# Patient Record
Sex: Female | Born: 1980 | Race: Black or African American | Hispanic: No | Marital: Single | Smoking: Former smoker
Health system: Southern US, Community
[De-identification: ages and names within clinical notes are randomized; demographics above are authoritative.]

## PROBLEM LIST (undated history)

## (undated) DIAGNOSIS — D573 Sickle-cell trait: Secondary | ICD-10-CM

## (undated) DIAGNOSIS — O1490 Unspecified pre-eclampsia, unspecified trimester: Secondary | ICD-10-CM

## (undated) DIAGNOSIS — H547 Unspecified visual loss: Secondary | ICD-10-CM

## (undated) HISTORY — DX: Unspecified pre-eclampsia, unspecified trimester: O14.90

## (undated) HISTORY — DX: Sickle-cell trait: D57.3

## (undated) HISTORY — DX: Unspecified visual loss: H54.7

## (undated) HISTORY — PX: WISDOM TOOTH EXTRACTION: SHX21

---

## 2006-05-29 ENCOUNTER — Emergency Department: Payer: Self-pay | Admitting: Emergency Medicine

## 2007-02-18 ENCOUNTER — Inpatient Hospital Stay: Payer: Self-pay | Admitting: Obstetrics and Gynecology

## 2008-04-04 ENCOUNTER — Emergency Department: Payer: Self-pay

## 2008-08-16 ENCOUNTER — Emergency Department: Payer: Self-pay | Admitting: Internal Medicine

## 2008-12-21 DIAGNOSIS — J45909 Unspecified asthma, uncomplicated: Secondary | ICD-10-CM | POA: Insufficient documentation

## 2011-04-20 ENCOUNTER — Inpatient Hospital Stay: Payer: Self-pay | Admitting: Obstetrics & Gynecology

## 2011-04-20 LAB — CBC WITH DIFFERENTIAL/PLATELET
Basophil #: 0 10*3/uL (ref 0.0–0.1)
Basophil %: 0.3 %
Eosinophil #: 0.1 10*3/uL (ref 0.0–0.7)
HGB: 11.9 g/dL — ABNORMAL LOW (ref 12.0–16.0)
Lymphocyte %: 20.8 %
MCH: 30.2 pg (ref 26.0–34.0)
MCHC: 33.8 g/dL (ref 32.0–36.0)
MCV: 89 fL (ref 80–100)
Monocyte #: 0.9 10*3/uL — ABNORMAL HIGH (ref 0.0–0.7)
Neutrophil %: 70 %
Platelet: 165 10*3/uL (ref 150–440)
RBC: 3.94 10*6/uL (ref 3.80–5.20)
WBC: 10.6 10*3/uL (ref 3.6–11.0)

## 2011-04-21 LAB — HEMATOCRIT: HCT: 31.8 % — ABNORMAL LOW (ref 35.0–47.0)

## 2012-03-03 ENCOUNTER — Emergency Department: Payer: Self-pay | Admitting: Emergency Medicine

## 2012-06-28 ENCOUNTER — Emergency Department: Payer: Self-pay | Admitting: Unknown Physician Specialty

## 2014-06-30 NOTE — H&P (Signed)
L&D Evaluation:  History Expanded:   HPI 34 yo G3P2 at 2540 + weeks EGA w estimated date of confinement 04/17/11 w Ctxs since 0530 but intensifying since 1700.  No vb or rom.  Good FM.   Prenatal Care at [facility] then [facility].  Plans Bilateral Tubal Ligation postpartum.  GBS +.    Gravida 3    Term 2    Group B Strep Results (Result >5wks must be treated as unknown) positive    Presents with contractions    Patient's Medical History No Chronic Illness    Patient's Surgical History none    Medications Pre Natal Vitamins    Allergies PCN    Social History none    Family History Non-Contributory   ROS:   ROS All systems were reviewed.  HEENT, CNS, GI, GU, Respiratory, CV, Renal and Musculoskeletal systems were found to be normal.   Exam:   Vital Signs stable    General no apparent distress    Mental Status clear    Chest clear    Heart normal sinus rhythm    Abdomen gravid, tender with contractions    Estimated Fetal Weight Average for gestational age    Back no CVAT    Edema no edema    Pelvic no external lesions, 7/80/0, BBOW    Mebranes Intact    FHT normal rate with no decels, 120 bpm rate. Possible decel when first put on monitor but 30 min of monitoring since until now shows no decels even w strong contractions    Ucx regular    Skin dry   Impression:   Impression active labor   Plan:   Plan monitor contractions and for cervical change, antibiotics for GBBS prophylaxis    Comments Wants epidural but understands there may not be enough time   Electronic Signatures: Letitia LibraHarris, Shyna Duignan Paul (MD)  (Signed [phone]-Feb-13 19:12)  Authored: L&D Evaluation   Last Updated: 28-Feb-13 19:12 by Letitia LibraHarris, Mounir Skipper Paul (MD)

## 2017-01-18 LAB — HM PAP SMEAR: HM Pap smear: NEGATIVE

## 2017-09-01 ENCOUNTER — Emergency Department
Admission: EM | Admit: 2017-09-01 | Discharge: 2017-09-01 | Disposition: A | Discharge status: Home / Self Care | Payer: Self-pay | Attending: Emergency Medicine | Admitting: Emergency Medicine

## 2017-09-01 ENCOUNTER — Encounter: Payer: Self-pay | Admitting: Emergency Medicine

## 2017-09-01 ENCOUNTER — Other Ambulatory Visit: Payer: Self-pay

## 2017-09-01 DIAGNOSIS — H10023 Other mucopurulent conjunctivitis, bilateral: Secondary | ICD-10-CM | POA: Insufficient documentation

## 2017-09-01 MED ORDER — OLOPATADINE HCL 0.2 % OP SOLN: 1.0000 [drp] | Freq: Every day | OPHTHALMIC | 0 refills | Status: DC

## 2017-09-01 MED ORDER — GENTAMICIN SULFATE 0.3 % OP SOLN: 1.0000 [drp] | OPHTHALMIC | 0 refills | Status: DC

## 2017-09-01 NOTE — ED Triage Notes (Signed)
Bilateral eye irritation and drainage.

## 2017-09-01 NOTE — ED Notes (Signed)
See triage note  Presents with pain and irritation to both eyes  States sx's started on left eye first  Both eyes were matted shut this am

## 2017-09-01 NOTE — ED Provider Notes (Signed)
[facility] Emergency Department Provider Note   ____________________________________________   First MD Initiated Contact with Patient 09/01/17 1228     (approximate)  I have reviewed the triage vital signs and the nursing notes.   HISTORY  Chief Complaint Eye Problem    HPI Tara Salas is a 37 y.o. female patient complaining of bilateral eye irritation, drainage, and matted eyelids.  Patient complains started with the left eye is migrated to the left eye.  Patient denies vision disturbance.  Patient rates her pain as a 5/10.  Patient described the pain is "itchy".  Patient do not wear contact lenses.  No palliative measure for complaint.  Patient is photophobic.   History reviewed. No pertinent past medical history.  There are no active problems to display for this patient.   History reviewed. No pertinent surgical history.  Prior to Admission medications   Medication Sig Start Date End Date Taking? Authorizing Provider  gentamicin (GARAMYCIN) 0.3 % ophthalmic solution Place 1 drop into both eyes every 4 (four) hours. 09/01/17   Joni ReiningSmith, Ronald K, PA-C  Olopatadine HCl 0.2 % SOLN Apply 1 drop to eye daily. 09/01/17   Joni ReiningSmith, Ronald K, PA-C    Allergies Penicillins  No family history on file.  Social History Social History   Tobacco Use  . Smoking status: Not on file  Substance Use Topics  . Alcohol use: Not on file  . Drug use: Not on file    Review of Systems Constitutional: No fever/chills Eyes: No visual changes.  Photophobic.  Matted eyelids.  Bilateral redness. ENT: No sore throat. Cardiovascular: Denies chest pain. Respiratory: Denies shortness of breath. Gastrointestinal: No abdominal pain.  No nausea, no vomiting.  No diarrhea.  No constipation. Genitourinary: Negative for dysuria. Musculoskeletal: Negative for back pain. Skin: Negative for rash. Neurological: Negative for headaches, focal weakness or  numbness.   ____________________________________________   PHYSICAL EXAM:  VITAL SIGNS: ED Triage Vitals  Enc Vitals Group     BP 09/01/17 1154 139/88     Pulse Rate 09/01/17 1154 68     Resp 09/01/17 1154 18     Temp 09/01/17 1154 99.3 F (37.4 C)     Temp Source 09/01/17 1154 Oral     SpO2 09/01/17 1154 100 %     Weight 09/01/17 1156 143 lb (64.9 kg)     Height 09/01/17 1156 5\' 4"  (1.626 m)     Head Circumference --      Peak Flow --      Pain Score 09/01/17 1155 5     Pain Loc --      Pain Edu? --      Excl. in GC? --    Constitutional: Alert and oriented. Well appearing and in no acute distress. Eyes: Conjunctivae are erythematous.  Photophobic.  PERRL. EOMI. Head: Atraumatic.  Dry secretions bilateral eyelids. Nose: No congestion/rhinnorhea. Cardiovascular: Normal rate, regular rhythm. Grossly normal heart sounds.  Good peripheral circulation. Respiratory: Normal respiratory effort.  No retractions. Lungs CTAB. Musculoskeletal: No lower extremity tenderness nor edema.  No joint effusions. Neurologic:  Normal speech and language. No gross focal neurologic deficits are appreciated. No gait instability. Skin:  Skin is warm, dry and intact. No rash noted. Psychiatric: Mood and affect are normal. Speech and behavior are normal.  ____________________________________________   LABS (all labs ordered are listed, but only abnormal results are displayed)  Labs Reviewed - No data to display ____________________________________________  EKG   ____________________________________________  RADIOLOGY  ED MD interpretation:    Official radiology report(s): No results found.  ____________________________________________   PROCEDURES  Procedure(s) performed: None  Procedures  Critical Care performed: No  ____________________________________________   INITIAL IMPRESSION / ASSESSMENT AND PLAN / ED COURSE  As part of my medical decision making, I reviewed the  following data within the electronic MEDICAL RECORD NUMBER    Patient presents with bilateral conjunctivitis patient.  Patient given discharge care instruction advised use eyedrops as directed.  Patient [facility].      ____________________________________________   FINAL CLINICAL IMPRESSION(S) / ED DIAGNOSES  Final diagnoses:  Other mucopurulent conjunctivitis of both eyes     ED Discharge Orders        Ordered    gentamicin (GARAMYCIN) 0.3 % ophthalmic solution  Every 4 hours     09/01/17 1239    Olopatadine HCl 0.2 % SOLN  Daily     09/01/17 1239       Note:  This document was prepared using Dragon voice recognition software and may include unintentional dictation errors.    Joni Reining, PA-C 09/01/17 1244    Jeanmarie Plant, MD 09/02/17 1140

## 2017-12-10 ENCOUNTER — Emergency Department
Admission: EM | Admit: 2017-12-10 | Discharge: 2017-12-10 | Disposition: A | Discharge status: Home / Self Care | Payer: Medicaid Other | Attending: Emergency Medicine | Admitting: Emergency Medicine

## 2017-12-10 ENCOUNTER — Encounter: Payer: Self-pay | Admitting: Emergency Medicine

## 2017-12-10 DIAGNOSIS — H10021 Other mucopurulent conjunctivitis, right eye: Secondary | ICD-10-CM | POA: Insufficient documentation

## 2017-12-10 DIAGNOSIS — H109 Unspecified conjunctivitis: Secondary | ICD-10-CM

## 2017-12-10 MED ORDER — TOBRAMYCIN 0.3 % OP SOLN: 2.0000 [drp] | OPHTHALMIC | 0 refills | Status: DC

## 2017-12-10 NOTE — Discharge Instructions (Signed)
Follow-up with your primary care provider if any continued problems.  Follow-up with [facility] if any severe worsening of your eye problem.  Begin using eyedrops every 4 hours while awake.  Make sure to wash your hands immediately after putting the eyedrops. If you begin having symptoms in your left eye also begin using the eyedrops to the left eye as well.

## 2017-12-10 NOTE — ED Provider Notes (Signed)
[facility] Emergency Department Provider Note   ____________________________________________   First MD Initiated Contact with Patient 12/10/17 1352     (approximate)  I have reviewed the triage vital signs and the nursing notes.   HISTORY  Chief Complaint Eye Drainage and Conjunctivitis   HPI Tara Salas is a 37 y.o. female presents to the ED with complaint of right eye drainage and redness for the last 2 days.  She states that she has 3 sons at home who recently treated for pinkeye.  She states that she thought that she was being extremely cautious with washing her hands after applying the eyedrops.  She denies any visual changes.  She used some over-the-counter eyedrops yesterday without any relief.  History reviewed. No pertinent past medical history.  There are no active problems to display for this patient.   History reviewed. No pertinent surgical history.  Prior to Admission medications   Medication Sig Start Date End Date Taking? Authorizing Provider  tobramycin (TOBREX) 0.3 % ophthalmic solution Place 2 drops into the right eye every 4 (four) hours. While awake 12/10/17   Tommi Rumps, PA-C    Allergies Penicillins  No family history on file.  Social History Social History   Tobacco Use  . Smoking status: Not on file  Substance Use Topics  . Alcohol use: Not on file  . Drug use: Not on file    Review of Systems Constitutional: No fever/chills Eyes: No visual changes.  Positive right eye drainage. ENT: Negative for rhinorrhea. Cardiovascular: Denies chest pain. Respiratory: Denies shortness of breath. Skin: Negative for rash. Neurological: Negative for headaches ___________________________________________   PHYSICAL EXAM:  VITAL SIGNS: ED Triage Vitals  Enc Vitals Group     BP 12/10/17 1306 (!) 148/78     Pulse Rate 12/10/17 1306 78     Resp 12/10/17 1306 20     Temp 12/10/17 1306 98.6 F (37 C)     Temp  Source 12/10/17 1306 Oral     SpO2 12/10/17 1306 99 %     Weight 12/10/17 1302 139 lb (63 kg)     Height 12/10/17 1302 5\' 4"  (1.626 m)     Head Circumference --      Peak Flow --      Pain Score 12/10/17 1302 4     Pain Loc --      Pain Edu? --      Excl. in GC? --    Constitutional: Alert and oriented. Well appearing and in no acute distress. Eyes: Conjunctivae on the right is injected with exudate present.  Exudate is also in the lashes.  Left conjunctivo-at this time is clear.  PERRL. EOMI. Head: Atraumatic. Nose: No congestion/rhinnorhea. Mouth/Throat: Mucous membranes are moist.  Oropharynx non-erythematous. Neck: No stridor.   Cardiovascular: Normal rate, regular rhythm. Grossly normal heart sounds.  Good peripheral circulation. Respiratory: Normal respiratory effort.  No retractions. Lungs CTAB. Musculoskeletal: Moves upper and lower extremities with any difficulty. Neurologic:  Normal speech and language. No gross focal neurologic deficits are appreciated. No gait instability. Skin:  Skin is warm, dry and intact. No rash noted. Psychiatric: Mood and affect are normal. Speech and behavior are normal.  ____________________________________________   LABS (all labs ordered are listed, but only abnormal results are displayed)  Labs Reviewed - No data to display  PROCEDURES  Procedure(s) performed: None  Procedures  Critical Care performed: No  ____________________________________________   INITIAL IMPRESSION / ASSESSMENT AND PLAN /  ED COURSE  As part of my medical decision making, I reviewed the following data within the electronic MEDICAL RECORD NUMBER Notes from prior ED visits and [place] Controlled Substance Database  Patient presents to the ED with complaint of right eye drainage and redness for 2 days.  She has 3 sons at home that was recently treated for conjunctivitis and she has been using eyedrops for them.  She thought that she was being extremely careful with  washing her hands but noticed redness beginning 2 days ago which is continued to get worse along with the drainage.  Exam is consistent with bacterial conjunctivitis.  Patient was given a prescription for tobramycin ophthalmic solution.  She is encouraged to follow-up with her PCP if any continued problems and [facility] if any worsening or not improving..  ____________________________________________   FINAL CLINICAL IMPRESSION(S) / ED DIAGNOSES  Final diagnoses:  Bacterial conjunctivitis of right eye     ED Discharge Orders         Ordered    tobramycin (TOBREX) 0.3 % ophthalmic solution  Every 4 hours     12/10/17 1433           Note:  This document was prepared using Dragon voice recognition software and may include unintentional dictation errors.    Tommi Rumps, PA-C 12/10/17 1441    Minna Antis, MD 12/10/17 1534

## 2017-12-10 NOTE — ED Triage Notes (Signed)
Pt reports itching and draining to right eye for the past 2 days.

## 2018-06-14 ENCOUNTER — Emergency Department
Admission: EM | Admit: 2018-06-14 | Discharge: 2018-06-14 | Disposition: A | Discharge status: Home / Self Care | Payer: Medicaid Other | Attending: Emergency Medicine | Admitting: Emergency Medicine

## 2018-06-14 ENCOUNTER — Other Ambulatory Visit: Payer: Self-pay

## 2018-06-14 DIAGNOSIS — T7421XA Adult sexual abuse, confirmed, initial encounter: Secondary | ICD-10-CM | POA: Insufficient documentation

## 2018-06-14 LAB — WET PREP, GENITAL
Clue Cells Wet Prep HPF POC: NONE SEEN
Sperm: NONE SEEN
Trich, Wet Prep: NONE SEEN
Yeast Wet Prep HPF POC: NONE SEEN

## 2018-06-14 LAB — RAPID HIV SCREEN (HIV 1/2 AB+AG)
HIV 1/2 Antibodies: NONREACTIVE
HIV-1 P24 Antigen - HIV24: NONREACTIVE

## 2018-06-14 LAB — CHLAMYDIA/NGC RT PCR (ARMC ONLY)
Chlamydia Tr: NOT DETECTED
N gonorrhoeae: NOT DETECTED

## 2018-06-14 MED ORDER — AZITHROMYCIN 500 MG PO TABS
1000.0000 mg | ORAL_TABLET | Freq: Once | ORAL | Status: AC
  Administered 2018-06-14: 1000 mg via ORAL
  Filled 2018-06-14: qty 2

## 2018-06-14 MED ORDER — HEPATITIS B VAC RECOMBINANT 10 MCG/0.5ML IJ SUSP
0.5000 mL | Freq: Once | INTRAMUSCULAR | Status: AC
  Administered 2018-06-14: 13:00:00 0.5 mL via INTRAMUSCULAR
  Filled 2018-06-14: qty 0.5

## 2018-06-14 MED ORDER — HEPATITIS B VAC RECOMBINANT 10 MCG/0.5ML IJ SUSP
1.0000 mL | Freq: Once | INTRAMUSCULAR | Status: DC
  Filled 2018-06-14: qty 1

## 2018-06-14 MED ORDER — METRONIDAZOLE 500 MG PO TABS
2000.0000 mg | ORAL_TABLET | Freq: Once | ORAL | Status: AC
  Administered 2018-06-14: 2000 mg via ORAL
  Filled 2018-06-14: qty 4

## 2018-06-14 MED ORDER — DOXYCYCLINE HYCLATE 100 MG PO CAPS: 100.0000 mg | ORAL_CAPSULE | Freq: Two times a day (BID) | ORAL | 0 refills | Status: AC
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
## 2018-06-14 NOTE — ED Notes (Signed)
SANE  NURSE  CALLED PER  DR  GOODMAN  MD 

## 2018-06-14 NOTE — ED Notes (Signed)
Pt on phone with sane nurse at this time

## 2018-06-14 NOTE — ED Notes (Signed)
This RN spoke with the sane RN who would like to speak with the pt, sane nurse to call back to get additional info

## 2018-06-14 NOTE — ED Notes (Signed)
[place] [employer] officer at front desk notified, and will contact the correct office to come speak to the patient and take report.
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
## 2018-06-15 LAB — RPR: RPR Ser Ql: NONREACTIVE
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
## 2018-07-30 LAB — HM HIV SCREENING LAB: HM HIV Screening: NEGATIVE

## 2018-09-06 ENCOUNTER — Other Ambulatory Visit: Payer: Self-pay

## 2018-09-06 ENCOUNTER — Ambulatory Visit: Payer: Self-pay | Admitting: Physician Assistant

## 2018-09-06 ENCOUNTER — Encounter: Payer: Self-pay | Admitting: Physician Assistant

## 2018-09-06 DIAGNOSIS — Z113 Encounter for screening for infections with a predominantly sexual mode of transmission: Secondary | ICD-10-CM

## 2018-09-06 DIAGNOSIS — B9689 Other specified bacterial agents as the cause of diseases classified elsewhere: Secondary | ICD-10-CM

## 2018-09-06 DIAGNOSIS — N760 Acute vaginitis: Secondary | ICD-10-CM

## 2018-09-06 LAB — WET PREP FOR TRICH, YEAST, CLUE
Trichomonas Exam: NEGATIVE
Yeast Exam: NEGATIVE

## 2018-09-06 MED ORDER — METRONIDAZOLE 500 MG PO TABS: 500.0000 mg | ORAL_TABLET | Freq: Two times a day (BID) | ORAL | 0 refills | Status: DC

## 2018-09-06 NOTE — Progress Notes (Signed)
STI clinic/screening visit  Subjective:  Tara Salas is a 38 y.o. female being seen today for an STI screening visit. The patient reports they do have symptoms.  Patient has the following medical conditionshas Asthma on their problem list.  Chief Complaint  Patient presents with  . SEXUALLY TRANSMITTED DISEASE    Patient reports vaginal itching and odor for a few days.  Denies other symptoms.  Reports new partner and desires screening for this reason as well. HPI   See flowsheet for further details and programmatic requirements.    The following portions of the patient's history were reviewed and updated as appropriate: allergies, current medications, past family history, past medical history, past social history, past surgical history and problem list. Problem list updated.  Objective:  There were no vitals filed for this visit.  Physical Exam Constitutional:      General: She is not in acute distress.    Appearance: Normal appearance.  HENT:     Head: Normocephalic and atraumatic.     Mouth/Throat:     Mouth: Mucous membranes are moist.     Pharynx: Oropharynx is clear. No oropharyngeal exudate or posterior oropharyngeal erythema.  Neck:     Musculoskeletal: Neck supple.  Pulmonary:     Effort: Pulmonary effort is normal.  Abdominal:     Palpations: Abdomen is soft. There is no mass.     Tenderness: There is no abdominal tenderness. There is no guarding or rebound.  Genitourinary:    General: Normal vulva.     Rectum: Normal.     Comments: External genitalia/pubic area without nits, lice, edema,erythema, lesions or inguinal adenopathy. Vagina with normal mucosa, moderate amount of thick, white, adherent discharge, pH=> 4.5. Cervix without visible lesions. Uterus normal size, firm, mobile, nt, no CMT, no masses, no adnexal tenderness or fullness. Lymphadenopathy:     Cervical: No cervical adenopathy.  Skin:    General: Skin is warm and dry.     Findings: No  bruising, erythema, lesion or rash.  Neurological:     Mental Status: She is alert and oriented to person, place, and time.  Psychiatric:        Mood and Affect: Mood normal.        Behavior: Behavior normal.        Thought Content: Thought content normal.        Judgment: Judgment normal.       Assessment and Plan:  Tara Salas is a 38 y.o. female presenting to the [facility] Department for STI screening  1. Screening for STD (sexually transmitted disease) Patient with symptoms today. Rec condoms with all sex Await test results.  Counseled that RN will call if needs to RTC for further treatment. - WET PREP FOR TRICH, YEAST, CLUE - Gonococcus culture - Chlamydia/Gonorrhea [place] [facility] - HIV [place] [facility] - Syphilis Serology,  Lab - metroNIDAZOLE (FLAGYL) 500 MG tablet; Take 1 tablet (500 mg total) by mouth 2 (two) times daily.  Dispense: 14 tablet; Refill: 0  2. BV (bacterial vaginosis) Will treat with Metronidazole 500mg  #14 1 po BID for 7days with food, no EtOH for 24 hr before and until 72 hr after completing medicine. No sex for 7days RTC prn  - metroNIDAZOLE (FLAGYL) 500 MG tablet; Take 1 tablet (500 mg total) by mouth 2 (two) times daily.  Dispense: 14 tablet; Refill: 0     No follow-ups on file.  Future Appointments  Date Time Provider  Department Center  09/12/2018  8:20 AM [facility] NURSE [facility] None    Matt Holmesarla J Jsean Taussig, PA

## 2018-09-11 LAB — GONOCOCCUS CULTURE

## 2018-09-12 ENCOUNTER — Other Ambulatory Visit: Payer: Self-pay

## 2018-09-12 ENCOUNTER — Ambulatory Visit (LOCAL_COMMUNITY_HEALTH_CENTER): Payer: Medicaid Other

## 2018-09-12 VITALS — BP 136/87 | Ht 64.5 in | Wt 153.3 lb

## 2018-09-12 DIAGNOSIS — Z30013 Encounter for initial prescription of injectable contraceptive: Secondary | ICD-10-CM | POA: Diagnosis not present

## 2018-09-12 DIAGNOSIS — Z3042 Encounter for surveillance of injectable contraceptive: Secondary | ICD-10-CM

## 2018-09-12 DIAGNOSIS — Z3009 Encounter for other general counseling and advice on contraception: Secondary | ICD-10-CM | POA: Diagnosis not present

## 2018-09-12 MED ORDER — MEDROXYPROGESTERONE ACETATE 150 MG/ML IM SUSP
150.0000 mg | Freq: Once | INTRAMUSCULAR | Status: AC
  Administered 2018-09-12: 09:00:00 150 mg via INTRAMUSCULAR

## 2018-09-12 NOTE — Progress Notes (Signed)
Last physical with [facility] 01/22/2018. Last depo 06/27/2018 with [facility]; 11.0 weeks post depo. DMPA 150 mg IM today per Donnal Moat, CNM order dated 01/22/2018. Female condoms given. MVI declined.

## 2018-11-28 ENCOUNTER — Other Ambulatory Visit: Payer: Self-pay

## 2018-11-28 ENCOUNTER — Ambulatory Visit (LOCAL_COMMUNITY_HEALTH_CENTER): Payer: Medicaid Other

## 2018-11-28 VITALS — BP 140/85 | Ht 65.5 in | Wt 151.0 lb

## 2018-11-28 DIAGNOSIS — Z3042 Encounter for surveillance of injectable contraceptive: Secondary | ICD-10-CM | POA: Diagnosis not present

## 2018-11-28 DIAGNOSIS — Z30013 Encounter for initial prescription of injectable contraceptive: Secondary | ICD-10-CM | POA: Diagnosis not present

## 2018-11-28 DIAGNOSIS — Z3009 Encounter for other general counseling and advice on contraception: Secondary | ICD-10-CM | POA: Diagnosis not present

## 2018-11-28 MED ORDER — MEDROXYPROGESTERONE ACETATE 150 MG/ML IM SUSP
150.0000 mg | Freq: Once | INTRAMUSCULAR | Status: AC
  Administered 2018-11-28: 16:00:00 150 mg via INTRAMUSCULAR

## 2018-11-28 NOTE — Progress Notes (Signed)
Depo given per E. Sciora CNM 01/2018 order. Tolerated well Aileen Fass, RN

## 2019-02-12 ENCOUNTER — Other Ambulatory Visit: Payer: Self-pay

## 2019-02-12 ENCOUNTER — Ambulatory Visit (LOCAL_COMMUNITY_HEALTH_CENTER): Payer: Medicaid Other

## 2019-02-12 VITALS — BP 134/81 | Ht 65.5 in | Wt 154.0 lb

## 2019-02-12 DIAGNOSIS — Z3042 Encounter for surveillance of injectable contraceptive: Secondary | ICD-10-CM

## 2019-02-12 DIAGNOSIS — Z30013 Encounter for initial prescription of injectable contraceptive: Secondary | ICD-10-CM | POA: Diagnosis not present

## 2019-02-12 DIAGNOSIS — Z3009 Encounter for other general counseling and advice on contraception: Secondary | ICD-10-CM

## 2019-02-12 MED ORDER — MEDROXYPROGESTERONE ACETATE 150 MG/ML IM SUSP
150.0000 mg | Freq: Once | INTRAMUSCULAR | Status: AC
  Administered 2019-02-12: 150 mg via INTRAMUSCULAR

## 2019-02-12 NOTE — Progress Notes (Signed)
Depo given per Ernestina Patches S.O. (1 Depo after PE expired). Tolerated well. Aileen Fass, RN

## 2019-04-30 ENCOUNTER — Other Ambulatory Visit: Payer: Self-pay | Admitting: Physician Assistant

## 2019-04-30 DIAGNOSIS — Z3042 Encounter for surveillance of injectable contraceptive: Secondary | ICD-10-CM

## 2019-04-30 MED ORDER — MEDROXYPROGESTERONE ACETATE 150 MG/ML IM SUSP
150.0000 mg | INTRAMUSCULAR | Status: AC
  Administered 2019-05-01 – 2019-12-19 (×4): 150 mg via INTRAMUSCULAR

## 2019-04-30 NOTE — Progress Notes (Signed)
Per chart review, last RP 01/2018, CBE due 2020 and pap due 12/2019.  Provided that patient desires to continue with Depo and BP is normal. OK to continue with Depo.  If abnl and FP consents have not been signed, please have patient sign.

## 2019-05-01 ENCOUNTER — Ambulatory Visit (LOCAL_COMMUNITY_HEALTH_CENTER): Payer: Medicaid Other

## 2019-05-01 ENCOUNTER — Other Ambulatory Visit: Payer: Self-pay

## 2019-05-01 VITALS — BP 127/87 | Ht 65.5 in | Wt 149.5 lb

## 2019-05-01 DIAGNOSIS — Z30013 Encounter for initial prescription of injectable contraceptive: Secondary | ICD-10-CM | POA: Diagnosis not present

## 2019-05-01 DIAGNOSIS — Z3009 Encounter for other general counseling and advice on contraception: Secondary | ICD-10-CM

## 2019-05-01 NOTE — Progress Notes (Signed)
nitial BP on entrance to exam room 140/90. Repeat BP 10 minutes after sitting was 127/87. Depo administered without difficulty per 04/30/19 written order of Sadie Haber PA and client tolerated without complaint. Client purchases MVI and takes ~ 2 - 3 times weekly. Folic acid counseling completed and encouraged to take QD. Jossie Ng, RN Family Planning and Abnormal Findings consents signed and sent for scanning. Jossie Ng, RN

## 2019-06-25 ENCOUNTER — Encounter: Payer: Self-pay | Admitting: Advanced Practice Midwife

## 2019-06-25 ENCOUNTER — Other Ambulatory Visit: Payer: Self-pay

## 2019-06-25 ENCOUNTER — Ambulatory Visit (LOCAL_COMMUNITY_HEALTH_CENTER): Payer: Medicaid Other | Admitting: Advanced Practice Midwife

## 2019-06-25 VITALS — BP 130/88 | Ht 65.5 in | Wt 153.6 lb

## 2019-06-25 DIAGNOSIS — E663 Overweight: Secondary | ICD-10-CM

## 2019-06-25 DIAGNOSIS — F1290 Cannabis use, unspecified, uncomplicated: Secondary | ICD-10-CM

## 2019-06-25 DIAGNOSIS — F17200 Nicotine dependence, unspecified, uncomplicated: Secondary | ICD-10-CM | POA: Insufficient documentation

## 2019-06-25 DIAGNOSIS — Z3009 Encounter for other general counseling and advice on contraception: Secondary | ICD-10-CM | POA: Diagnosis not present

## 2019-06-25 DIAGNOSIS — Z3042 Encounter for surveillance of injectable contraceptive: Secondary | ICD-10-CM

## 2019-06-25 LAB — WET PREP FOR TRICH, YEAST, CLUE
Trichomonas Exam: NEGATIVE
Yeast Exam: NEGATIVE

## 2019-06-25 MED ORDER — MULTI-VITAMIN/MINERALS PO TABS: 1.0000 | ORAL_TABLET | Freq: Every day | ORAL | 0 refills | Status: DC

## 2019-06-25 NOTE — Progress Notes (Signed)
Wet mount reviewed by Hazle Coca CNM and no treatment needed per provider / standing order. Jossie Ng, RN

## 2019-06-25 NOTE — Progress Notes (Signed)
Family Planning Visit- Initial Visit  Subjective:  Tara Salas is a 39 y.o. SBF smoker G6P3003   being seen today for an initial well woman visit and to discuss family planning options.  She is currently using DMPA for pregnancy prevention. Patient reports she does not want a pregnancy in the next year.  Patient has the following medical conditions has Asthma; Smoker 1 ppd; and Marijuana use on their problem list.  Chief Complaint  Patient presents with  . Annual Exam    (Last Depo 05/01/2019)    Patient reports happy with DMPA and no menses since began.  Last PE 01/2018.  CBE due 2020.  Last pap 01/18/17 neg HPV neg.  Last sex 03/2019 without condom.  No sexual partner currently.  Last ETOH 06/21/19.  Last MJ 05/24/19.  Working 40 hrs/wk.  Smoker 1/2-1 ppd  Patient denies any problems   Body mass index is 25.17 kg/m. - Patient is eligible for diabetes screening based on BMI and age >53?  not applicable IR4E ordered? not applicable  Patient reports 1 of partners in last year. Desires STI screening?  Yes  Has patient been screened once for HCV in the past?  No  No results found for: HCVAB  Does the patient have current of drug use, have a partner with drug use, and/or has been incarcerated since last result? Yes  If yes-- Screen for HCV through [facility]   Does the patient meet criteria for HBV testing? Yes  Criteria:  -Household, sexual or needle sharing contact with HBV -History of drug use -HIV positive -Those with known Hep C   Health Maintenance Due  Topic Date Due  . COVID-19 Vaccine (1) Never done  . TETANUS/TDAP  Never done    Review of Systems  Constitutional: Negative.   HENT: Negative.   Respiratory: Negative.   Cardiovascular: Negative.   Gastrointestinal: Negative.   Genitourinary: Negative.   Musculoskeletal: Negative.   Skin: Negative.   Neurological: Negative.   Endo/Heme/Allergies: Negative.   Psychiatric/Behavioral: Negative.   All other  systems reviewed and are negative.   The following portions of the patient's history were reviewed and updated as appropriate: allergies, current medications, past family history, past medical history, past social history, past surgical history and problem list. Problem list updated.   See flowsheet for other program required questions.  Objective:   Vitals:   06/25/19 0835  BP: 130/88  Weight: 153 lb 9.6 oz (69.7 kg)  Height: 5' 5.5" (1.664 m)    Physical Exam Constitutional:      Appearance: Normal appearance. She is normal weight.  HENT:     Head: Normocephalic and atraumatic.     Mouth/Throat:     Mouth: Mucous membranes are moist.  Eyes:     Conjunctiva/sclera: Conjunctivae normal.  Cardiovascular:     Rate and Rhythm: Normal rate and regular rhythm.  Pulmonary:     Effort: Pulmonary effort is normal.     Breath sounds: Normal breath sounds.  Chest:     Breasts:        Right: Normal.        Left: Normal.  Abdominal:     Palpations: Abdomen is soft.  Genitourinary:    General: Normal vulva.     Exam position: Lithotomy position.     Rectum: Normal.  Musculoskeletal:        General: Normal range of motion.     Cervical back: Normal range of motion and neck  supple.  Skin:    General: Skin is warm and dry.  Neurological:     Mental Status: She is alert.  Psychiatric:        Mood and Affect: Mood normal.       Assessment and Plan:  Tara Salas is a 39 y.o. female presenting to the [facility] Department for an initial well woman exam/family planning visit  Contraception counseling: Reviewed all forms of birth control options in the tiered based approach. available including abstinence; over the counter/barrier methods; hormonal contraceptive medication including pill, patch, ring, injection,contraceptive implant, ECP; hormonal and nonhormonal IUDs; permanent sterilization options including vasectomy and the various tubal sterilization  modalities. Risks, benefits, and typical effectiveness rates were reviewed.  Questions were answered.  Written information was also given to the patient to review.  Patient desires DMPA, this was prescribed for patient. She will follow up on 07/17/19 for surveillance.  She was told to call with any further questions, or with any concerns about this method of contraception.  Emphasized use of condoms 100% of the time for STI prevention.  Patient was offered ECP. ECP was not accepted by the patient. ECP counseling was not given - see RN documentation  1. Family planning Normal yearly exam - Multiple Vitamins-Minerals (MULTIVITAMIN WITH MINERALS) tablet; Take 1 tablet by mouth daily.  Dispense: 100 tablet; Refill: 0 - WET PREP FOR TRICH, YEAST, CLUE - Syphilis Serology, [place] [facility] - HIV/HCV [place] [facility] - HBV Antigen/[facility] [place] [facility] - Chlamydia/Gonorrhea [place] [facility] - IGP, Aptima HPV  2. Encounter for surveillance of injectable contraceptive May have DMPA 150 mg IM q 11-13 wks x 1 year (not today) Please schedule DMPA 07/17/19  3. Smoker 1 ppd Counseled via 5 A's to stop smoking  4. Marijuana use Counseled not to use     No follow-ups on file.  No future appointments.  Alberteen Spindle, CNM

## 2019-06-30 LAB — IGP, APTIMA HPV
HPV Aptima: NEGATIVE
PAP Smear Comment: 0

## 2019-07-01 ENCOUNTER — Encounter: Payer: Self-pay | Admitting: Advanced Practice Midwife

## 2019-07-01 DIAGNOSIS — R87619 Unspecified abnormal cytological findings in specimens from cervix uteri: Secondary | ICD-10-CM | POA: Insufficient documentation

## 2019-07-01 LAB — HM HIV SCREENING LAB: HM HIV Screening: NEGATIVE

## 2019-07-01 LAB — HM HEPATITIS C SCREENING LAB: HM Hepatitis Screen: NEGATIVE

## 2019-07-02 LAB — HEPATITIS B SURFACE ANTIGEN

## 2019-07-18 ENCOUNTER — Other Ambulatory Visit: Payer: Self-pay

## 2019-07-18 ENCOUNTER — Ambulatory Visit (LOCAL_COMMUNITY_HEALTH_CENTER): Payer: Medicaid Other

## 2019-07-18 VITALS — BP 136/95 | Ht 65.5 in | Wt 153.0 lb

## 2019-07-18 DIAGNOSIS — Z3009 Encounter for other general counseling and advice on contraception: Secondary | ICD-10-CM

## 2019-07-18 DIAGNOSIS — Z3042 Encounter for surveillance of injectable contraceptive: Secondary | ICD-10-CM

## 2019-07-18 DIAGNOSIS — Z30013 Encounter for initial prescription of injectable contraceptive: Secondary | ICD-10-CM

## 2019-07-18 NOTE — Progress Notes (Addendum)
Here for depo. Consult with C.Hampton, PA d/t elevated BP of 136/95. BP retaken with reading of 130/90. Pt. Denies any previous BP issues and admits to smoking directly before appt.Also reports anxiety during  Dr office appts.  Pt. Counseled re: smoking risks. Provider advises to continue with depo from order 06/25/19 by Hazle Coca, CNM. Depo 150mg  IM given today with problem. , RN Note: Depo given today without problem. Pt. Tolerated well. Chart routed to C. Upton, STAVANGER. [place], RN

## 2019-07-18 NOTE — Progress Notes (Signed)
Consulted by RN re:  BP elevation and patient here for Depo.  OK given for Depo today.  Rec that patient not smoke for 1-2 hr prior to appointments and follow up with PCP.  Reviewed RN note and agree with note as documented.

## 2019-08-26 LAB — HEPATITIS B SURFACE ANTIGEN

## 2019-08-28 ENCOUNTER — Encounter: Payer: Self-pay | Admitting: Family Medicine

## 2019-10-03 ENCOUNTER — Other Ambulatory Visit: Payer: Self-pay

## 2019-10-03 ENCOUNTER — Ambulatory Visit (LOCAL_COMMUNITY_HEALTH_CENTER): Payer: Medicaid Other

## 2019-10-03 VITALS — BP 150/102 | Ht 65.0 in | Wt 160.0 lb

## 2019-10-03 DIAGNOSIS — Z3009 Encounter for other general counseling and advice on contraception: Secondary | ICD-10-CM

## 2019-10-03 DIAGNOSIS — Z3042 Encounter for surveillance of injectable contraceptive: Secondary | ICD-10-CM

## 2019-10-03 NOTE — Progress Notes (Signed)
Pateint reports no signs or symptoms today related to elevated blood pressure, but lately her ankles and feet have been swelling and she has had vision spells with seeing dots.  She denies any headaches. Consult with Sadie Haber, PA regarding elevated blood pressure today 150/102 and 2nd blood pressure 141/100.   Per Albin Felling: Ok to give Depo today, but patient to see PCP ASAP for an evaluation. DMPA 150mg  given IM today per Verbal Order by , PA related to elevated BP and tolerated well.  Pateint given PCP resource sheet due to not currently having one. Sadie Haber, RN

## 2019-12-19 ENCOUNTER — Other Ambulatory Visit: Payer: Self-pay

## 2019-12-19 ENCOUNTER — Ambulatory Visit (LOCAL_COMMUNITY_HEALTH_CENTER): Payer: Medicaid Other

## 2019-12-19 VITALS — BP 138/82 | Ht 65.0 in | Wt 158.0 lb

## 2019-12-19 DIAGNOSIS — Z3009 Encounter for other general counseling and advice on contraception: Secondary | ICD-10-CM

## 2019-12-19 DIAGNOSIS — Z3042 Encounter for surveillance of injectable contraceptive: Secondary | ICD-10-CM

## 2019-12-19 DIAGNOSIS — Z30013 Encounter for initial prescription of injectable contraceptive: Secondary | ICD-10-CM

## 2019-12-19 NOTE — Progress Notes (Signed)
Pt is 11.0 weeks post depo today. DMPA 150 mg IM administered per Arnetha Courser, CNM order dated 06/25/19 (1 year depo order).

## 2020-03-09 ENCOUNTER — Other Ambulatory Visit: Payer: Self-pay

## 2020-03-09 ENCOUNTER — Ambulatory Visit (LOCAL_COMMUNITY_HEALTH_CENTER): Payer: Medicaid Other

## 2020-03-09 VITALS — BP 140/99 | Ht 65.0 in | Wt 157.5 lb

## 2020-03-09 DIAGNOSIS — Z3009 Encounter for other general counseling and advice on contraception: Secondary | ICD-10-CM | POA: Diagnosis not present

## 2020-03-09 DIAGNOSIS — Z3042 Encounter for surveillance of injectable contraceptive: Secondary | ICD-10-CM

## 2020-03-09 MED ORDER — MEDROXYPROGESTERONE ACETATE 150 MG/ML IM SUSP
150.0000 mg | Freq: Once | INTRAMUSCULAR | Status: AC
  Administered 2020-03-09: 150 mg via INTRAMUSCULAR

## 2020-03-09 NOTE — Progress Notes (Signed)
Consulted by RN re: patient with elevated BP and due to get Depo today.  Reviewed RN note and agree that it reflects our discussion and my recommendation for patient to see PCP for eval and monitoring of BP.  OK for Depo today.

## 2020-03-09 NOTE — Progress Notes (Signed)
11 weeks 4 days post depo. BP elevated today 153/102 then later 140/99. Reports high BP "when going to doctor." Has no PCP. RN  Discussed with pt need to establish PCP and have BP monitored. Denies h/a, chest pain, vision changes today. Admits to "seeing spots" two months ago and occasional swelling of hands. Pt in agreement with establishing PCP. Local provider resource list given. Consult with Beatris Si, PA who gives order for pt to receive depo today. Depo 150 mg IM given L UOQ without difficulty per C. [place], [place] order today. Tolerated well. Next depo due 05/25/20, pt aware. Depo consent signed today. Jerel Shepherd, RN

## 2020-06-14 ENCOUNTER — Ambulatory Visit (LOCAL_COMMUNITY_HEALTH_CENTER): Payer: Medicaid Other

## 2020-06-14 ENCOUNTER — Other Ambulatory Visit: Payer: Self-pay

## 2020-06-14 VITALS — BP 148/100 | Ht 65.0 in | Wt 163.0 lb

## 2020-06-14 DIAGNOSIS — Z3042 Encounter for surveillance of injectable contraceptive: Secondary | ICD-10-CM

## 2020-06-14 DIAGNOSIS — Z3009 Encounter for other general counseling and advice on contraception: Secondary | ICD-10-CM | POA: Diagnosis not present

## 2020-06-14 MED ORDER — MEDROXYPROGESTERONE ACETATE 150 MG/ML IM SUSP
150.0000 mg | Freq: Once | INTRAMUSCULAR | Status: AC
  Administered 2020-06-14: 150 mg via INTRAMUSCULAR

## 2020-06-14 NOTE — Progress Notes (Signed)
Attestation of Attending Supervision of clinical support staff: I agree with the care provided to this patient and was available for any consultation.  I have reviewed the RN's note and chart. I was available for consult and to see the patient if needed.   Depo OK to administer. Patient was appropriately counseled about establishing PCP care.   Federico Flake, MD, MPH, ABFM Attending Physician Faculty Practice- [facility]

## 2020-06-14 NOTE — Progress Notes (Signed)
13 weeks 6 days post depo. BP elevated today 148/100. Reports smoking before appt and says she is stressed this morning d/t conflict with her children's father. Denies chest pain, SOB, blurry vision. Has not established PCP. RN counseled on impt of establishing PCP and having BP evaluated. Local provider resource list given and explained. Pt plans to call soon. Consult K. Alvester Morin, MD who gives ok for Depo today and emphasizes need for pt to establish PCP. Provider orders carried out. Depo given today RUOQ per VO Dr. Kirtland Bouchard. [place]. Tolerated well. Next depo and physical due 08/30/2020, pt aware. Jerel Shepherd, RN

## 2020-08-30 ENCOUNTER — Ambulatory Visit (LOCAL_COMMUNITY_HEALTH_CENTER): Payer: Medicaid Other | Admitting: Physician Assistant

## 2020-08-30 ENCOUNTER — Encounter: Payer: Self-pay | Admitting: Physician Assistant

## 2020-08-30 ENCOUNTER — Other Ambulatory Visit: Payer: Self-pay

## 2020-08-30 VITALS — BP 144/90 | Ht 64.0 in | Wt 163.2 lb

## 2020-08-30 DIAGNOSIS — Z3042 Encounter for surveillance of injectable contraceptive: Secondary | ICD-10-CM

## 2020-08-30 DIAGNOSIS — Z3009 Encounter for other general counseling and advice on contraception: Secondary | ICD-10-CM

## 2020-08-30 DIAGNOSIS — Z124 Encounter for screening for malignant neoplasm of cervix: Secondary | ICD-10-CM

## 2020-08-30 DIAGNOSIS — Z01419 Encounter for gynecological examination (general) (routine) without abnormal findings: Secondary | ICD-10-CM

## 2020-08-30 DIAGNOSIS — Z113 Encounter for screening for infections with a predominantly sexual mode of transmission: Secondary | ICD-10-CM

## 2020-08-30 MED ORDER — MEDROXYPROGESTERONE ACETATE 150 MG/ML IM SUSP
150.0000 mg | INTRAMUSCULAR | Status: AC
  Administered 2020-08-30 – 2021-05-05 (×4): 150 mg via INTRAMUSCULAR

## 2020-08-30 NOTE — Progress Notes (Signed)
Pt here for PE and Depo injection.  Depo 150 mg given IM without any complications.  Pt given reminder card for next injection. Berdie Ogren, RN

## 2020-08-31 ENCOUNTER — Encounter: Payer: Self-pay | Admitting: Physician Assistant

## 2020-08-31 NOTE — Progress Notes (Signed)
Family Planning Visit- Repeat Yearly Visit  Subjective:  Tara Salas is a 40 y.o. G3P0003  being seen today for an annual wellness visit and to discuss contraception options.   The patient is currently using Depo Provera for pregnancy prevention. Patient does not want a pregnancy in the next year. Patient has the following medical problems: has Asthma; Smoker 1 ppd; Marijuana use; Overweight BMI=25.1; and Abnormal Pap smear of cervix LGSIL HPV neg 06/25/19 on their problem list.  Chief Complaint  Patient presents with   Contraception    Annual exam and continue with Depo.    Patient reports that she is doing well with the Depo and desires to continue with this as her BCM.  States that she has been monitoring her BP at home and it has been in the normal range and that it seems it is only high when she comes to the doctor.  Reports that she did smoke a cigarette in the car on the way here this am.  Per chart review, CBE due in 2024 and needs repeat pap today.  Patient denies any concerns today.    See flowsheet for other program required questions.   Body mass index is 28.01 kg/m. - Patient is eligible for diabetes screening based on BMI and age >46?  not applicable HA1C ordered? not applicable  Patient reports 0 of partners in last year. Desires STI screening?  No - patient declines and not indicated.   Has patient been screened once for HCV in the past?  No  No results found for: HCVAB  Does the patient have current of drug use, have a partner with drug use, and/or has been incarcerated since last result? No  If yes-- Screen for HCV through [facility]   Does the patient meet criteria for HBV testing? No  Criteria:  -Household, sexual or needle sharing contact with HBV -History of drug use -HIV positive -Those with known Hep C   Health Maintenance Due  Topic Date Due   COVID-19 Vaccine (1) Never done   Pneumococcal Vaccine 63-70 Years old (1 - PCV) Never done    TETANUS/TDAP  Never done   PAP SMEAR-Modifier  06/24/2020    Review of Systems  All other systems reviewed and are negative.  The following portions of the patient's history were reviewed and updated as appropriate: allergies, current medications, past family history, past medical history, past social history, past surgical history and problem list. Problem list updated.  Objective:   Vitals:   08/30/20 0844  BP: (!) 150/102  Weight: 163 lb 3.2 oz (74 kg)  Height: 5\' 4"  (1.626 m)    Physical Exam Vitals and nursing note reviewed.  Constitutional:      General: She is not in acute distress.    Appearance: Normal appearance.  HENT:     Head: Normocephalic and atraumatic.     Mouth/Throat:     Mouth: Mucous membranes are moist.     Pharynx: Oropharynx is clear. No oropharyngeal exudate or posterior oropharyngeal erythema.  Eyes:     Conjunctiva/sclera: Conjunctivae normal.  Neck:     Thyroid: No thyroid mass, thyromegaly or thyroid tenderness.  Cardiovascular:     Rate and Rhythm: Normal rate and regular rhythm.  Pulmonary:     Effort: Pulmonary effort is normal.     Breath sounds: Normal breath sounds.  Abdominal:     Palpations: Abdomen is soft. There is no mass.     Tenderness: There is  no abdominal tenderness. There is no guarding or rebound.  Genitourinary:    General: Normal vulva.     Rectum: Normal.     Comments: External genitalia/pubic area without nits, lice, edema, erythema, lesions and inguinal adenopathy. Vagina with normal mucosa and discharge. Cervix without visible lesions. Uterus firm, mobile, nt, no masses, no CMT, no adnexal tenderness or fullness.  Musculoskeletal:     Cervical back: Neck supple. No tenderness.  Lymphadenopathy:     Cervical: No cervical adenopathy.  Skin:    General: Skin is warm and dry.     Findings: No bruising, erythema, lesion or rash.  Neurological:     Mental Status: She is alert and oriented to person, place, and time.   Psychiatric:        Mood and Affect: Mood normal.        Behavior: Behavior normal.        Thought Content: Thought content normal.        Judgment: Judgment normal.      Assessment and Plan:  Tara Salas is a 40 y.o. female G3P0003 presenting to the [facility] Department for an yearly wellness and contraception visit  Contraception counseling: Reviewed all forms of birth control options in the tiered based approach. available including abstinence; over the counter/barrier methods; hormonal contraceptive medication including pill, patch, ring, injection,contraceptive implant, ECP; hormonal and nonhormonal IUDs; permanent sterilization options including vasectomy and the various tubal sterilization modalities. Risks, benefits, and typical effectiveness rates were reviewed.  Questions were answered.  Written information was also given to the patient to review.  Patient desires to continue with Depo, this was prescribed for patient. She will follow up in  3 months and prn for surveillance.  She was told to call with any further questions, or with any concerns about this method of contraception.  Emphasized use of condoms 100% of the time for STI prevention.  Patient was not a candidate for ECP today.    1. Encounter for counseling regarding contraception Reviewed with patient as above re: BCM options. Reviewed with patient normal SE of Depo and when to call clinic with concerns. Enc condoms with all sex for STD protection.   2. Routine Papanicolaou smear Await test results.  Counseled that RN will call or send letter once results are back.  - IGP, Aptima HPV  3. Well woman exam with routine gynecological exam Reviewed with patient healthy habits to maintain general health. Enc MVI 1 po daily. Counseled patient that BP was elevated today and stressed to patient need to follow up with PCP.  Repeat BP after exam done by me in room was 144/90. Enc to establish with/ follow up  with PCP for primary care concerns, age appropriate screenings and illness.   4. Surveillance for Depo-Provera contraception Continue with Depo 150 mg IM q 11-13 weeks for 1 year. - medroxyPROGESTERone (DEPO-PROVERA) injection 150 mg  5. Screening for STD (sexually transmitted disease) Await test results.  Counseled that RN will call if needs to RTC for treatment once results are back.  - HIV/HCV [place] [facility] - Syphilis Serology, [place] [facility]     Return 11-13 weeks, for Depo injection.  No future appointments.  Matt Holmes, PA

## 2020-09-02 LAB — HM HEPATITIS C SCREENING LAB: HM Hepatitis Screen: NEGATIVE

## 2020-09-02 LAB — IGP, APTIMA HPV
HPV Aptima: NEGATIVE
PAP Smear Comment: 0

## 2020-09-02 LAB — HM HIV SCREENING LAB: HM HIV Screening: NEGATIVE

## 2020-11-05 ENCOUNTER — Telehealth: Payer: Self-pay

## 2020-11-05 ENCOUNTER — Ambulatory Visit: Payer: Medicaid Other

## 2020-11-05 NOTE — Telephone Encounter (Signed)
Phone call to pt. Pt has depo appt scheduled this afternoon. However, it is too early for her to receive depo as it has been only 9 weeks 4 days since last depo which was on 08/30/2020. RN explained this to pt and she agrees to reschedule depo appt for 11/15/2020, arrival 8 am. This appt will be 11 weeks since last depo. Pt needs early morning appt in order to get to work. RN agrees to work her in at that time. Jerel Shepherd, RN

## 2020-11-15 ENCOUNTER — Ambulatory Visit: Payer: Medicaid Other

## 2020-11-30 ENCOUNTER — Other Ambulatory Visit: Payer: Self-pay

## 2020-11-30 ENCOUNTER — Ambulatory Visit (LOCAL_COMMUNITY_HEALTH_CENTER): Payer: Medicaid Other

## 2020-11-30 VITALS — BP 137/94 | Ht 64.0 in | Wt 166.5 lb

## 2020-11-30 DIAGNOSIS — Z3042 Encounter for surveillance of injectable contraceptive: Secondary | ICD-10-CM | POA: Diagnosis not present

## 2020-11-30 DIAGNOSIS — Z3009 Encounter for other general counseling and advice on contraception: Secondary | ICD-10-CM | POA: Diagnosis not present

## 2020-11-30 NOTE — Progress Notes (Signed)
Consult with Sadie Haber, PA regarding elevated blood pressure today 137/94. Per Sadie Haber, PA: okay to give Depo today.  DMPA 150 mg given IM (Right Glute) per Verbal Order today by Sadie Haber, PA and tolerated well. Hart Carwin, RN

## 2020-11-30 NOTE — Progress Notes (Signed)
Consulted by RN re: patient situation.  Reviewed RN note and agree that it reflects our discussion and my recommendations. 

## 2021-01-31 ENCOUNTER — Emergency Department
Admission: EM | Admit: 2021-01-31 | Discharge: 2021-01-31 | Disposition: A | Discharge status: Home / Self Care | Payer: Medicaid Other | Attending: Emergency Medicine | Admitting: Emergency Medicine

## 2021-01-31 ENCOUNTER — Ambulatory Visit (LOCAL_COMMUNITY_HEALTH_CENTER): Payer: Medicaid Other

## 2021-01-31 ENCOUNTER — Other Ambulatory Visit: Payer: Self-pay

## 2021-01-31 VITALS — BP 176/113

## 2021-01-31 DIAGNOSIS — F17210 Nicotine dependence, cigarettes, uncomplicated: Secondary | ICD-10-CM | POA: Insufficient documentation

## 2021-01-31 DIAGNOSIS — Z3009 Encounter for other general counseling and advice on contraception: Secondary | ICD-10-CM

## 2021-01-31 DIAGNOSIS — I10 Essential (primary) hypertension: Secondary | ICD-10-CM

## 2021-01-31 LAB — COMPREHENSIVE METABOLIC PANEL
ALT: 32 U/L (ref 0–44)
AST: 25 U/L (ref 15–41)
Albumin: 4.4 g/dL (ref 3.5–5.0)
Alkaline Phosphatase: 43 U/L (ref 38–126)
Anion gap: 7 (ref 5–15)
BUN: 13 mg/dL (ref 6–20)
CO2: 22 mmol/L (ref 22–32)
Calcium: 9.5 mg/dL (ref 8.9–10.3)
Chloride: 108 mmol/L (ref 98–111)
Creatinine, Ser: 0.73 mg/dL (ref 0.44–1.00)
GFR, Estimated: >60 mL/min (ref >60)
Glucose, Bld: 101 mg/dL — ABNORMAL HIGH (ref 70–99)
Potassium: 3.6 mmol/L (ref 3.5–5.1)
Sodium: 137 mmol/L (ref 135–145)
Total Bilirubin: 0.8 mg/dL (ref 0.3–1.2)
Total Protein: 8 g/dL (ref 6.5–8.1)

## 2021-01-31 LAB — CBC
HCT: 40.5 % (ref 36.0–46.0)
Hemoglobin: 13.4 g/dL (ref 12.0–15.0)
MCH: 29.4 pg (ref 26.0–34.0)
MCHC: 33.1 g/dL (ref 30.0–36.0)
MCV: 88.8 fL (ref 80.0–100.0)
Platelets: 205 10*3/uL (ref 150–400)
RBC: 4.56 MIL/uL (ref 3.87–5.11)
RDW: 13.9 % (ref 11.5–15.5)
WBC: 6.4 10*3/uL (ref 4.0–10.5)
nRBC: 0 % (ref 0.0–0.2)

## 2021-01-31 MED ORDER — HYDROCHLOROTHIAZIDE 25 MG PO TABS: 25.0000 mg | ORAL_TABLET | Freq: Every day | ORAL | 1 refills | Status: DC

## 2021-01-31 NOTE — ED Notes (Signed)
Pt presents to the ED for hypertension, denies hx other than when she was pregnant which pt states was approx 15 years ago. Denies any lightheadedness or dizziness. States she has a headache but it is from not having any food today. Pt states that it was reading 160s/170s systolic this AM. Pt is A&Ox4 and NAD

## 2021-01-31 NOTE — ED Provider Notes (Signed)
[facility] Emergency Department Provider Note ____________________________________________   Event Date/Time   First MD Initiated Contact with Patient 01/31/21 1445     (approximate)  I have reviewed the triage vital signs and the nursing notes.   HISTORY  Chief Complaint Hypertension    HPI Tara Salas is a 40 y.o. female with PMH as noted below who presents with elevated blood pressure when she was [facility].  The patient states that she previously had hypertension during her first pregnancy.  She thinks that her blood pressure may have been high more recently and occasional would feel swelling in her hands and feet which she thought was associated with elevated blood pressure.  She had been feeling this feeling in the last few days, so she asked her blood pressure to be checked.  The patient denies any other symptoms.  She has no chest pain, shortness of breath, weakness or lightheadedness, severe headache, palpitations, or urinary symptoms.  Past Medical History:  Diagnosis Date   Preeclampsia    Sickle cell trait (HCC)    Vision problems    seeing flashing lights (occasionally - gegan approx 3 months ago)    Patient Active Problem List   Diagnosis Date Noted   Abnormal Pap smear of cervix LGSIL HPV neg 06/25/19 07/01/2019   Smoker 1 ppd 06/25/2019   Marijuana use 06/25/2019   Overweight BMI=25.1 06/25/2019   Asthma 12/21/2008    Past Surgical History:  Procedure Laterality Date   WISDOM TOOTH EXTRACTION     x4, 2013    Prior to Admission medications   Medication Sig Start Date End Date Taking? Authorizing Provider  hydrochlorothiazide (HYDRODIURIL) 25 MG tablet Take 1 tablet (25 mg total) by mouth daily. 01/31/21 04/01/21 Yes Dionne Bucy, MD  calcium-vitamin D (OSCAL WITH D) 500-200 MG-UNIT tablet Take 1 tablet by mouth.    [provider]  ferrous sulfate 325 (65 FE) MG tablet Take 325  mg by mouth daily with breakfast.    [provider]  metroNIDAZOLE (FLAGYL) 500 MG tablet Take 1 tablet (500 mg total) by mouth 2 (two) times daily. Patient not taking: Reported on 11/30/2020 09/06/18   Matt Holmes, PA  Multiple Vitamins-Minerals (HAIR SKIN AND NAILS FORMULA) TABS Take by mouth 1 day or 1 dose.    [provider]  Multiple Vitamins-Minerals (MULTIVITAMIN WITH MINERALS) tablet Take 1 tablet by mouth daily. Patient not taking: No sig reported 06/25/19   Federico Flake, MD  tobramycin (TOBREX) 0.3 % ophthalmic solution Place 2 drops into the right eye every 4 (four) hours. While awake Patient not taking: Reported on 11/30/2020 12/10/17   Tommi Rumps, PA-C    Allergies Penicillins  Family History  Problem Relation Age of Onset   Diabetes Mother    Heart disease Mother    Hypertension Mother    High blood pressure Mother    Sickle cell trait Father    Diabetes Maternal Grandmother    Cancer Maternal Grandmother    High blood pressure Maternal Grandmother    Cancer Paternal Grandmother    Sickle cell trait Son     Social History Social History   Tobacco Use   Smoking status: Every Day    Packs/day: 0.50    Types: Cigarettes   Smokeless tobacco: Never  Vaping Use   Vaping Use: Former   Devices: Only used few weeks  Substance Use Topics   Alcohol use:  Yes    Alcohol/week: 6.0 standard drinks    Types: 6 Shots of liquor per week    Comment: socially   Drug use: Yes    Types: Marijuana    Comment: Last use one month ago    Review of Systems  Constitutional: No fever/chills Eyes: No visual changes. ENT: No sore throat. Cardiovascular: Denies chest pain. Respiratory: Denies shortness of breath. Gastrointestinal: No vomiting or diarrhea.  Genitourinary: Negative for dysuria.  Musculoskeletal: Negative for back pain. Skin: Negative for rash. Neurological: Negative for  headache.  ____________________________________________   PHYSICAL EXAM:  VITAL SIGNS: ED Triage Vitals  Enc Vitals Group     BP 01/31/21 1244 (!) 192/111     Pulse Rate 01/31/21 1244 62     Resp 01/31/21 1244 19     Temp 01/31/21 1244 98.7 F (37.1 C)     Temp Source 01/31/21 1244 Oral     SpO2 01/31/21 1244 99 %     Weight --      Height --      Head Circumference --      Peak Flow --      Pain Score 01/31/21 1249 3     Pain Loc --      Pain Edu? --      Excl. in [place]? --     Constitutional: Alert and oriented. Well appearing and in no acute distress. Eyes: Conjunctivae are normal.  Head: Atraumatic. Nose: No congestion/rhinnorhea. Mouth/Throat: Mucous membranes are moist.   Neck: Normal range of motion.  Cardiovascular: Normal rate, regular rhythm. Good peripheral circulation. Respiratory: Normal respiratory effort.   Gastrointestinal: No distention.  Musculoskeletal: Extremities warm and well perfused.  Neurologic:  Normal speech and language. No gross focal neurologic deficits are appreciated.  Skin:  Skin is warm and dry. No rash noted. Psychiatric: Mood and affect are normal. Speech and behavior are normal.  ____________________________________________   LABS (all labs ordered are listed, but only abnormal results are displayed)  Labs Reviewed  COMPREHENSIVE METABOLIC PANEL - Abnormal; Notable for the following components:      Result Value   Glucose, Bld 101 (*)    All other components within normal limits  CBC   ____________________________________________  EKG   ____________________________________________  RADIOLOGY    ____________________________________________   PROCEDURES  Procedure(s) performed: No  Procedures  Critical Care performed: No ____________________________________________   INITIAL IMPRESSION / ASSESSMENT AND PLAN / ED COURSE  Pertinent labs & imaging results that were available during my care of the patient were  reviewed by me and considered in my medical decision making (see chart for details).   40 year old female with PMH as noted above presents with asymptomatic hypertension noted at an outpatient visit for Depo shot today.  She has no associated symptoms.  Blood pressure here has been as high as 192/111 although during my exam it was around 140/100.  Lab work-up was obtained from triage and is within normal limits.  The creatinine is normal.  Based on level of the blood pressure and the lack of symptoms, there is no indication for further ED work-up such as EKG or cardiac enzymes.  The patient is stable for discharge home.  She does not have a PMD currently (although does have Medicaid and is planning to make an appointment with one) and I suspect that her blood pressure may have been elevated for a while, so I will start her on hydrochlorothiazide today.  I gave the patient thorough return precautions  and she expressed understanding.   ____________________________________________   FINAL CLINICAL IMPRESSION(S) / ED DIAGNOSES  Final diagnoses:  Asymptomatic hypertension      NEW MEDICATIONS STARTED DURING THIS VISIT:  Discharge Medication List as of 01/31/2021  3:29 PM     START taking these medications   Details  hydrochlorothiazide (HYDRODIURIL) 25 MG tablet Take 1 tablet (25 mg total) by mouth daily., Starting Mon 01/31/2021, Until Fri 04/01/2021, Normal         Note:  This document was prepared using Dragon voice recognition software and may include unintentional dictation errors.    Dionne Bucy, MD 01/31/21 [phone]

## 2021-01-31 NOTE — ED Triage Notes (Signed)
Pt states she was at the health department today to get her depo shot and they sent her here for HTN. Pt states she had HTN with her first pregnancy but non since, states she does get HA frequently but no other issues.

## 2021-01-31 NOTE — Discharge Instructions (Addendum)
Take the hydrochlorothiazide as prescribed.  Make an appointment to follow-up with a primary care physician within the next month.  Return to the ER for new, worsening, or persistent severe elevated blood pressures (especially over 200 on the top number or over 120 on the bottom number), chest pain, difficulty breathing, severe headache, or any other new or worsening symptoms that concern you.

## 2021-01-31 NOTE — Progress Notes (Signed)
In Nurse Clinic for depo. Last depo 11/30/2020, 8 weeks 6 days post depo. Too early for depo. Counseled pt that earliest next depo can be given is 12 /27/2022, reminder given. BP elevated today at 176/113 and then 15 min later 163/107. Pt states no PCP and no previous evaluation for HBP.   Local provider list given. Advised pt to go to ER now/seek immediate medical attn. E Sciora, CNM aware.  Pt in agreement. Jerel Shepherd, RN

## 2021-01-31 NOTE — ED Notes (Signed)
Pt d/c home per MD order. Discharge summary reviewed with pt, pt verbalizes understanding. Ambulatory off unit. No s/s of acute distress noted at discharge.  °

## 2021-02-17 ENCOUNTER — Ambulatory Visit (LOCAL_COMMUNITY_HEALTH_CENTER): Payer: Medicaid Other

## 2021-02-17 ENCOUNTER — Other Ambulatory Visit: Payer: Self-pay

## 2021-02-17 VITALS — BP 135/89 | Ht 64.0 in | Wt 158.5 lb

## 2021-02-17 DIAGNOSIS — Z3009 Encounter for other general counseling and advice on contraception: Secondary | ICD-10-CM | POA: Diagnosis not present

## 2021-02-17 DIAGNOSIS — Z3042 Encounter for surveillance of injectable contraceptive: Secondary | ICD-10-CM | POA: Diagnosis not present

## 2021-02-17 NOTE — Progress Notes (Signed)
11 weeks 2 days post depo. Voices no concerns. BP 135/89 today. Taking BP med. Pt working on establishing PCP at [facility]. Depo given today per order by Beatris Si, PA dated 08/30/2020. Tolerated well LUOQ. Next depo due 05/05/2021, has reminder. Jerel Shepherd, RN

## 2021-05-05 ENCOUNTER — Ambulatory Visit (LOCAL_COMMUNITY_HEALTH_CENTER): Payer: Medicaid Other | Admitting: Nurse Practitioner

## 2021-05-05 ENCOUNTER — Other Ambulatory Visit: Payer: Self-pay

## 2021-05-05 VITALS — BP 156/103 | Ht 64.0 in | Wt 163.0 lb

## 2021-05-05 DIAGNOSIS — Z30013 Encounter for initial prescription of injectable contraceptive: Secondary | ICD-10-CM

## 2021-05-05 DIAGNOSIS — Z3009 Encounter for other general counseling and advice on contraception: Secondary | ICD-10-CM | POA: Diagnosis not present

## 2021-05-05 DIAGNOSIS — Z3042 Encounter for surveillance of injectable contraceptive: Secondary | ICD-10-CM

## 2021-05-05 NOTE — Progress Notes (Signed)
Patient in nurse clinic today for her Depo.  Last Depo given 02/17/2021 ([redacted]w[redacted]d).  Reports no problems noted with Depo.  Over the course of last year patient has had continuous elevated BP readings at Depo visits. BP upon arrival to clinic 180/119, advised patient to sit for 15 minutes and then BP will be reevaluated.  BP at recheck was 156/109.  Patient denies shortness of breath, altered vision, chest pain, jaw and arm pain.  Discussed risk associated with elevated BP.  Advised to report to ED if signs and symptoms occur.  Patient states that she has not been able to obtain a PCP.  Stressed the importance of obtaining a PCP and BP management through medication.  Patient has FPW, given [facility].  Will also [facility] a [facility].  Patient given Depo today, due to Depo being a progestin only method, which is not associated with risk of elevated BP.  Injection tolerated will.  Next Depo due 07/21/21, RP due 08/2021.  Glenna Fellows, FNP  ?

## 2021-05-13 ENCOUNTER — Emergency Department: Payer: Medicaid Other

## 2021-05-13 ENCOUNTER — Other Ambulatory Visit: Payer: Self-pay

## 2021-05-13 DIAGNOSIS — Y9361 Activity, american tackle football: Secondary | ICD-10-CM | POA: Insufficient documentation

## 2021-05-13 DIAGNOSIS — W500XXA Accidental hit or strike by another person, initial encounter: Secondary | ICD-10-CM | POA: Insufficient documentation

## 2021-05-13 DIAGNOSIS — S8392XA Sprain of unspecified site of left knee, initial encounter: Secondary | ICD-10-CM | POA: Insufficient documentation

## 2021-05-13 NOTE — ED Triage Notes (Signed)
Injury to left knee. Pt family was playing football when she was accidentally hit in the left knee and fell to the ground. ?

## 2021-05-14 ENCOUNTER — Emergency Department
Admission: EM | Admit: 2021-05-14 | Discharge: 2021-05-14 | Disposition: A | Discharge status: Home / Self Care | Payer: Medicaid Other | Attending: Emergency Medicine | Admitting: Emergency Medicine

## 2021-05-14 DIAGNOSIS — S8392XA Sprain of unspecified site of left knee, initial encounter: Secondary | ICD-10-CM

## 2021-05-14 MED ORDER — IBUPROFEN 800 MG PO TABS: 800.0000 mg | ORAL_TABLET | Freq: Three times a day (TID) | ORAL | 0 refills | Status: DC | PRN

## 2021-05-14 MED ORDER — OXYCODONE-ACETAMINOPHEN 5-325 MG PO TABS
1.0000 | ORAL_TABLET | Freq: Once | ORAL | Status: AC
  Administered 2021-05-14: 1 via ORAL
  Filled 2021-05-14: qty 1

## 2021-05-14 NOTE — ED Notes (Signed)
Discharge instructions provided to patient with script info and follow-up. Patient verbalized understanding. Ace wrap applied and crutches provided with teaching. Patient was taken outside to drive herself home but her sister parked her car on the far end of the parking lot so this nurse went to get her car and pulled it up front for her. Patient was very gracious. ?

## 2021-05-14 NOTE — ED Provider Notes (Signed)
? ?[facility] ?Provider Note ? ? ? Event Date/Time  ? First MD Initiated Contact with Patient 05/14/21 0116   ?  (approximate) ? ? ?History  ? ?Leg Injury ? ? ?HPI ? ?Tara Salas is a 41 y.o. female with history of sickle cell trait who presents for evaluation of left knee pain.  Patient reports that she was watching her son football game when she got tackled accidentally by one of the players.  They hit her onto the lateral aspect of her left knee.  She tried to walk afterwards but she had a lot of pain.  She noticed significant swelling of the knee.  She denies hip pain, head trauma, neck pain, back pain.  Her pain is constant, throbbing, worse with weightbearing ?  ? ? ?Past Medical History:  ?Diagnosis Date  ? Preeclampsia   ? Sickle cell trait ([place])   ? Vision problems   ? seeing flashing lights (occasionally - gegan approx 3 months ago)  ? ? ?Past Surgical History:  ?Procedure Laterality Date  ? WISDOM TOOTH EXTRACTION    ? x4, 2013  ? ? ? ?Physical Exam  ? ?Triage Vital Signs: ?ED Triage Vitals [05/13/21 2247]  ?Enc Vitals Group  ?   BP (!) 165/120  ?   Pulse Rate 78  ?   Resp 18  ?   Temp 98.3 ?F (36.8 ?C)  ?   Temp Source Oral  ?   SpO2 99 %  ?   Weight 163 lb (73.9 kg)  ?   Height 5\' 4"  (1.626 m)  ?   Head Circumference   ?   Peak Flow   ?   Pain Score 10  ?   Pain Loc   ?   Pain Edu?   ?   Excl. in [place]?   ? ? ?Most recent vital signs: ?Vitals:  ? 05/13/21 2247  ?BP: (!) 165/120  ?Pulse: 78  ?Resp: 18  ?Temp: 98.3 ?F (36.8 ?C)  ?SpO2: 99%  ? ? ? ?Constitutional: Alert and oriented. No acute distress. Does not appear intoxicated. ?HEENT ?Head: Normocephalic and atraumatic. ?Face: No facial bony tenderness. Stable midface ?Ears: No hemotympanum bilaterally. No Battle sign ?Eyes: No eye injury. PERRL. No raccoon eyes ?Nose: Nontender. No epistaxis. No rhinorrhea ?Mouth/Throat: Mucous membranes are moist. No oropharyngeal blood. No dental injury. Airway patent without stridor. Normal  voice. ?Neck: no C-collar. No midline c-spine tenderness.  ?Cardiovascular: Normal rate, regular rhythm. Normal and symmetric distal pulses are present in all extremities. ?Pulmonary/Chest: Chest wall is stable and nontender to palpation/compression. Normal respiratory effort. Breath sounds are normal. No crepitus.  ?Abdominal: Soft, nontender, non distended. ?Musculoskeletal: Left knee is diffusely swollen with some what limited range of motion due to pain but no obvious deformity.  Nontender with normal full range of motion in all extremities. No deformities. No thoracic or lumbar midline spinal tenderness. Pelvis is stable. ?Skin: Skin is warm, dry and intact. No abrasions or contutions. ?Psychiatric: Speech and behavior are appropriate. ?Neurological: Normal speech and language. Moves all extremities to command. No gross focal neurologic deficits are appreciated. ? ? ? ?ED Results / Procedures / Treatments  ? ?Labs ?(all labs ordered are listed, but only abnormal results are displayed) ?Labs Reviewed - No data to display ? ? ?EKG ? ?none ? ? ?RADIOLOGY ?I, Rudene Re, attending MD, have personally viewed and interpreted the images obtained during this visit as below: ? ?X-ray negative for fracture or  dislocation ? ? ?___________________________________________________ ?Interpretation by Radiologist:  ?DG Knee Complete 4 Views Left ? ?Result Date: 05/13/2021 ?CLINICAL DATA:  Fall EXAM: LEFT KNEE - COMPLETE 4+ VIEW COMPARISON:  None. FINDINGS: No evidence of fracture, dislocation, or joint effusion. No evidence of arthropathy or other focal bone abnormality. Soft tissues are unremarkable. IMPRESSION: Negative. Electronically Signed   By: Donavan Foil M.D.   On: 05/13/2021 23:20   ? ? ? ?PROCEDURES: ? ?Critical Care performed: No ? ?Procedures ? ? ? ?IMPRESSION / MDM / ASSESSMENT AND PLAN / ED COURSE  ?I reviewed the triage vital signs and the nursing notes. ? ?42 y.o. female with history of sickle cell  trait who presents for evaluation of traumatic left knee pain.  Generalized swelling with somewhat limited range of motion due to pain, joint is stable otherwise with no obvious deformities. ? ?Ddx: Contusion versus sprain versus fracture versus dislocation  ? ? ?Plan: X-ray ? ? ?MEDICATIONS GIVEN IN ED: ?Medications  ?oxyCODONE-acetaminophen (PERCOCET/ROXICET) 5-325 MG per tablet 1 tablet (has no administration in time range)  ? ? ? ?ED COURSE: X-ray negative for fracture or dislocation.  Knee was wrapped, patient given crutches, 1 Percocet for pain.  Discussed rice therapy and pain management at home.  If patient is not fully healed in 1 week recommended follow-up with [facility] for further evaluation and possible MRI. ? ? ?Consults: None ? ? ?EMR reviewed including last visit with patient's primary care doctor for a well woman exam in July 2022 ? ? ? ?FINAL CLINICAL IMPRESSION(S) / ED DIAGNOSES  ? ?Final diagnoses:  ?Sprain of left knee, unspecified ligament, initial encounter  ? ? ? ?Rx / DC Orders  ? ?ED Discharge Orders   ? ?      Ordered  ?  ibuprofen (ADVIL) 800 MG tablet  Every 8 hours PRN       ? 05/14/21 0138  ? ?  ?  ? ?  ? ? ? ?Note:  This document was prepared using Dragon voice recognition software and may include unintentional dictation errors. ? ? ?Please note:  Patient was evaluated in Emergency Department today for the symptoms described in the history of present illness. Patient was evaluated in the context of the global COVID-19 pandemic, which necessitated consideration that the patient might be at risk for infection with the SARS-CoV-2 virus that causes COVID-19. Institutional protocols and algorithms that pertain to the evaluation of patients at risk for COVID-19 are in a state of rapid change based on information released by regulatory bodies including the CDC and federal and state organizations. These policies and algorithms were followed during the patient's care in the ED.  Some ED  evaluations and interventions may be delayed as a result of limited staffing during the pandemic. ? ? ? ? ?  ?Rudene Re, MD ?05/14/21 [id number] ? ?

## 2021-07-25 ENCOUNTER — Ambulatory Visit (LOCAL_COMMUNITY_HEALTH_CENTER): Payer: BC Managed Care – PPO

## 2021-07-25 VITALS — BP 156/104 | Ht 64.0 in | Wt 161.5 lb

## 2021-07-25 DIAGNOSIS — Z3042 Encounter for surveillance of injectable contraceptive: Secondary | ICD-10-CM | POA: Diagnosis not present

## 2021-07-25 DIAGNOSIS — Z3009 Encounter for other general counseling and advice on contraception: Secondary | ICD-10-CM

## 2021-07-25 MED ORDER — MEDROXYPROGESTERONE ACETATE 150 MG/ML IM SUSP
150.0000 mg | Freq: Once | INTRAMUSCULAR | Status: AC
  Administered 2021-07-25: 150 mg via INTRAMUSCULAR

## 2021-07-25 NOTE — Progress Notes (Signed)
11 weeks 4 days post depo. BP elevated today at 164/109 and 10 min later 156/104. Denies chest pain, headache, vision changes. Pt explains she smoked two cigarettes before appt and "feels nervous" when getting BP taken. Reports she has not f-u with [facility] as recommended by provider at [facility] visit in 04/2021, but has the brochure and phone number and plans to do so before next appt.   Consult A White, FNP who gives ok for depo today. Recommends pt to f-u with [facility] for BP evaluation and to seek immediate medical attn if symptoms arise.   RN carried out provider orders. Discussed with pt the impt of BP f-u and risks assoc with HBP. Depo given today. Tolerated well LUOQ. Return PE and depo due 10/10/2021, has reminder. Jerel Shepherd, RN

## 2021-08-31 ENCOUNTER — Ambulatory Visit (LOCAL_COMMUNITY_HEALTH_CENTER): Payer: Medicaid Other | Admitting: Family Medicine

## 2021-08-31 ENCOUNTER — Encounter: Payer: Self-pay | Admitting: Family Medicine

## 2021-08-31 VITALS — BP 157/111 | Ht 64.0 in | Wt 163.6 lb

## 2021-08-31 DIAGNOSIS — Z30013 Encounter for initial prescription of injectable contraceptive: Secondary | ICD-10-CM

## 2021-08-31 DIAGNOSIS — Z113 Encounter for screening for infections with a predominantly sexual mode of transmission: Secondary | ICD-10-CM

## 2021-08-31 DIAGNOSIS — Z3009 Encounter for other general counseling and advice on contraception: Secondary | ICD-10-CM

## 2021-08-31 DIAGNOSIS — I10 Essential (primary) hypertension: Secondary | ICD-10-CM | POA: Insufficient documentation

## 2021-08-31 LAB — HM HIV SCREENING LAB: HM HIV Screening: NEGATIVE

## 2021-08-31 LAB — WET PREP FOR TRICH, YEAST, CLUE
Trichomonas Exam: NEGATIVE
Yeast Exam: NEGATIVE

## 2021-08-31 MED ORDER — MEDROXYPROGESTERONE ACETATE 150 MG/ML IM SUSP: 150.0000 mg | INTRAMUSCULAR | Status: DC

## 2021-08-31 NOTE — Progress Notes (Signed)
[facility] DEPARTMENT [facility] [address] Main Number: [phone]  Family Planning Visit- Repeat Yearly Visit  Subjective:  Tara Salas is a 41 y.o. G3P0003  being seen today for an annual wellness visit and to discuss contraception options.   The patient is currently using Hormonal Injection for pregnancy prevention. Patient does not want a pregnancy in the next year.    report they are looking for a method that provides Cycle control   Patient has the following medical problems: has Asthma; Smoker 1 ppd; Marijuana use; Overweight BMI=25.1; and Abnormal Pap smear of cervix LGSIL HPV neg 06/25/19 on their problem list.  Chief Complaint  Patient presents with   Annual Exam    PE    Patient reports she is here for her annual exam and Depo RX renewal.  Last pap was 08/30/2020- NILM and HPV -.  Has a vaginal discharge would like to have a wet pep today and HIV.  Declines other testing and pelvic exam.  Client is aware of her  elevated BP and would like information of PCP options.  Patient denies other concerns.  See flowsheet for other program required questions.   Body mass index is 28.08 kg/m. - Patient is eligible for diabetes screening based on BMI and age >32?  not applicable HA1C ordered? not applicable  Patient reports 0 partners in last year. Desires STI screening?  Yes   Has patient been screened once for HCV in the past?  Yes  No results found for: "HCVAB"- 08/2020  Does the patient have current of drug use, have a partner with drug use, and/or has been incarcerated since last result? No  If yes-- Screen for HCV through [facility]   Does the patient meet criteria for HBV testing? No  Criteria:  -Household, sexual or needle sharing contact with HBV -History of drug use -HIV positive -Those with known Hep C   Health Maintenance Due  Topic Date Due   COVID-19 Vaccine (1) Never done   TETANUS/TDAP  Never done     Review of Systems  Genitourinary:        Vaginal discharge x several days.  All other systems reviewed and are negative.   The following portions of the patient's history were reviewed and updated as appropriate: allergies, current medications, past family history, past medical history, past social history, past surgical history and problem list. Problem list updated.  Objective:   Vitals:   08/31/21 0824 08/31/21 0838  BP: (!) 156/105 (!) 157/111  Weight: 163 lb 9.6 oz (74.2 kg)   Height: 5\' 4"  (1.626 m)     Physical Exam Constitutional:      Appearance: Normal appearance.  HENT:     Head: Normocephalic and atraumatic.  Pulmonary:     Effort: Pulmonary effort is normal.  Abdominal:     Palpations: Abdomen is soft.  Musculoskeletal:        General: Normal range of motion.  Skin:    General: Skin is warm and dry.  Neurological:     General: No focal deficit present.     Mental Status: She is alert.  Psychiatric:        Mood and Affect: Mood normal.        Behavior: Behavior normal.       Assessment and Plan:  Tara Salas is a 41 y.o. female G3P0003 presenting to the [facility] Department for an yearly wellness and contraception visit  Contraception counseling: Reviewed options based on patient desire and reproductive life plan. Patient is interested in Hormonal Injection. This was provided to the patient today.   Risks, benefits, and typical effectiveness rates were reviewed.  Questions were answered.  Written information was also given to the patient to review.    The patient will follow up in  2 months for surveillance.  The patient was told to call with any further questions, or with any concerns about this method of contraception.  Emphasized use of condoms 100% of the time for STI prevention.  Patient was assessed for need for ECP- client has had consistent use of Depo and isn't a candidate.   1. Family planning Last Depo given  07/25/2021 - medroxyPROGESTERone (DEPO-PROVERA) injection 150 mg x 1 year  2. Initiation of Depo Provera  - medroxyPROGESTERone (DEPO-PROVERA) injection 150 mg  3. Screening examination for venereal disease  Treat wet prep as per SO. - WET PREP FOR TRICH, YEAST, CLUE - HIV [place] [facility]   4. Elevated blood pressure reading with diagnosis of hypertension Co client on need to establish PC.  Client given [facility] information and PC resources. Client states that she will make an appointment for evaluation and management.   Return in about 2 months (around 11/01/2021) for DEpo Provera injection.  No future appointments.  Larene Pickett, FNP

## 2021-08-31 NOTE — Progress Notes (Signed)
WET PREP negative. No treatment indicated today in clinic. Application given for [facility]. Family Planning packet provided. Pt verbalizes understanding of further labwork pending.  Lethea Killings RN

## 2021-10-13 ENCOUNTER — Ambulatory Visit (LOCAL_COMMUNITY_HEALTH_CENTER): Payer: Medicaid Other | Admitting: Advanced Practice Midwife

## 2021-10-13 ENCOUNTER — Encounter: Payer: Self-pay | Admitting: Advanced Practice Midwife

## 2021-10-13 VITALS — BP 156/101 | Ht 64.0 in | Wt 166.4 lb

## 2021-10-13 DIAGNOSIS — Z3009 Encounter for other general counseling and advice on contraception: Secondary | ICD-10-CM

## 2021-10-13 DIAGNOSIS — Z113 Encounter for screening for infections with a predominantly sexual mode of transmission: Secondary | ICD-10-CM

## 2021-10-13 DIAGNOSIS — Z3042 Encounter for surveillance of injectable contraceptive: Secondary | ICD-10-CM

## 2021-10-13 DIAGNOSIS — R87619 Unspecified abnormal cytological findings in specimens from cervix uteri: Secondary | ICD-10-CM

## 2021-10-13 LAB — WET PREP FOR TRICH, YEAST, CLUE
Trichomonas Exam: NEGATIVE
Yeast Exam: NEGATIVE

## 2021-10-13 MED ORDER — MEDROXYPROGESTERONE ACETATE 150 MG/ML IM SUSP
150.0000 mg | INTRAMUSCULAR | Status: AC
  Administered 2021-10-13 – 2022-06-07 (×4): 150 mg via INTRAMUSCULAR

## 2021-10-13 NOTE — Progress Notes (Signed)
[place] [facility] [address] Main Number: [phone]  Contraception/Family Planning VISIT ENCOUNTER NOTE  Subjective:   Tara Salas is a 41 y.o. SBF exsmoker G3P0003 female here for reproductive life counseling. The patient is currently using Hormonal Injection to prevent pregnancy.  Here for STD check and DMPA.  Last DMPA 07/25/21. Last sex 10/08/21. Last PE 08/31/21. Last pap 08/30/20 neg HPV neg.   The patient does not want a pregnancy in the next year.    Client states they are looking for the following:  High efficacy at preventing pregnancy  Denies abnormal vaginal bleeding, discharge, pelvic pain, problems with intercourse or other gynecologic concerns.    Gynecologic History No LMP recorded. Patient has had an injection.  Health Maintenance Due  Topic Date Due   COVID-19 Vaccine (1) Never done   TETANUS/TDAP  02/26/2021   INFLUENZA VACCINE  09/20/2021     The following portions of the patient's history were reviewed and updated as appropriate: allergies, current medications, past family history, past medical history, past social history, past surgical history and problem list.  Review of Systems Pertinent items are noted in HPI.   Objective:  BP (!) 156/101   Ht 5\' 4"  (1.626 m)   Wt 166 lb 6.4 oz (75.5 kg)   BMI 28.56 kg/m  Gen: well appearing, NAD HEENT: no scleral icterus CV: RR Lung: Normal WOB Ext: warm well perfused  PELVIC: Normal appearing external genitalia; normal appearing vaginal mucosa and cervix.  No abnormal discharge noted.  Pap smear not obtained.  Normal uterine size, no other palpable masses, no uterine or adnexal tenderness.   Assessment and Plan:   Need for emergency contraceptive care was assessed today.  Last unprotected sex was:  10/08/21 Patient reported not meeting criteria.  Reviewed options and patient desired No method of ECP, declined all    Contraception counseling:  Reviewed methods in a patient centered fashion and used shared decision making with the patient. Utilized [employer] patient education tools as appropriate. The patient stated there goals and desires from a method are: High efficacy at preventing pregnancy  We reviewed the following methods in detail based on patient preferences available included: Hormonal Injection  Patient expressed they would like Hormonal Injection  This was provided to the patient today.  if not why not clearly documented  Risks, benefits, and typical effectiveness rates were reviewed.  Questions were answered.  Written information was also given to the patient to review.       will follow up in  11-13 weeks for surveillance.   was told to call with any further questions, or with any concerns about this method of contraception or cycle control.  Emphasized use of condoms 100% of the time for STI prevention.   1. Family planning 171/110 and 156/101 Pt not finished completing [facility] but plans to do so next week Took last antihypertensive med given in ER fall 2022 Denies h/a. Last scotoma 08/2021 Quit smoking 55 days ago; denies vaping, cigars Strongly counseled to go to ER and [facility] ASAP On 08/31/21 =157/111 and 156/105.  On 07/25/21 =164/109  2. Encounter for surveillance of injectable contraceptive May have DMPA 150 mg IM today per 08/31/21 order  3. Abnormal cervical Papanicolaou smear, unspecified abnormal pap finding Needs pap 2025  4. Screening examination for venereal disease Treat wet mount per standing orders Immunization nurse consult - WET PREP FOR TRICH, YEAST, CLUE - Chlamydia/Gonorrhea  [place] [facility]    Please refer to After Visit Summary for other counseling recommendations.   No follow-ups on file.  Alberteen Spindle, CNM [facility] DEPARTMENT

## 2021-10-13 NOTE — Progress Notes (Signed)
WET PREP negative except CLUE and no treatment indicated in clinic today. Pt was given DEPO LUOQ and appointment reminder card given for next injection. Pt verbalized understanding of further labwork pending.  Bw[place]enheft RN

## 2021-11-21 ENCOUNTER — Ambulatory Visit: Payer: Self-pay | Admitting: Advanced Practice Midwife

## 2021-11-21 ENCOUNTER — Encounter: Payer: Self-pay | Admitting: Advanced Practice Midwife

## 2021-11-21 ENCOUNTER — Ambulatory Visit: Payer: Medicaid Other

## 2021-11-21 DIAGNOSIS — T7411XA Adult physical abuse, confirmed, initial encounter: Secondary | ICD-10-CM | POA: Insufficient documentation

## 2021-11-21 DIAGNOSIS — T7422XA Child sexual abuse, confirmed, initial encounter: Secondary | ICD-10-CM

## 2021-11-21 DIAGNOSIS — Z113 Encounter for screening for infections with a predominantly sexual mode of transmission: Secondary | ICD-10-CM

## 2021-11-21 DIAGNOSIS — T7422XS Child sexual abuse, confirmed, sequela: Secondary | ICD-10-CM

## 2021-11-21 DIAGNOSIS — T7411XS Adult physical abuse, confirmed, sequela: Secondary | ICD-10-CM

## 2021-11-21 HISTORY — DX: Child sexual abuse, confirmed, initial encounter: T74.22XA

## 2021-11-21 LAB — WET PREP FOR TRICH, YEAST, CLUE
Trichomonas Exam: NEGATIVE
Yeast Exam: NEGATIVE

## 2021-11-21 NOTE — Progress Notes (Signed)
Wet Prep reviewed - no treatment indicated.   Al Decant, RN

## 2021-11-21 NOTE — Progress Notes (Signed)
[facility] Department  STI clinic/screening visit Tara Salas [place] [address] [phone]  Subjective:  Tara Salas is a 41 y.o. SBF exsmoker G3P3 female being seen today for an STI screening visit. The patient reports they do not have symptoms.  Patient reports that they do not desire a pregnancy in the next year.   They reported they are not interested in discussing contraception today.    No LMP recorded. Patient has had an injection.   Patient has the following medical conditions:   Patient Active Problem List   Diagnosis Date Noted   Elevated blood pressure reading with diagnosis of hypertension 156/101 on 10/13/21 08/31/2021   Abnormal Pap smear of cervix LGSIL HPV neg 06/25/19 07/01/2019   Smoker 1 ppd 06/25/2019   Marijuana use 06/25/2019   Overweight BMI=25.1 06/25/2019   Asthma 12/21/2008    Chief Complaint  Patient presents with   SEXUALLY TRANSMITTED DISEASE    HPI  Patient reports asymptomatic. Last sex 11/12/21 with condom that popped inside her. LMP not on DMPA. Last DMPA 10/13/21. Last MJ 08/2021. Last ETOH 11/20/21 (2 beers+2 shots Tequila) q weekend.  Last cig 08/20/21. Last vaped 2016.  Last HIV test per patient/review of record was 08/31/21 Patient reports last pap was 08/30/20 neg HPV neg  Screening for MPX risk: Does the patient have an unexplained rash? No Is the patient MSM? No Does the patient endorse multiple sex partners or anonymous sex partners? No Did the patient have close or sexual contact with a person diagnosed with MPX? No Has the patient traveled outside the [place] where MPX is endemic? No Is there a high clinical suspicion for MPX-- evidenced by one of the following No  -Unlikely to be chickenpox  -Lymphadenopathy  -Rash that present in same phase of evolution on any given body part See flowsheet for further details and programmatic requirements.   Immunization history:  Immunization History  Administered  Date(s) Administered   Hepatitis B 11/27/1994, 12/25/1994, 06/04/1995   Hepatitis B, PED/ADOLESCENT 06/14/2018   MMR 06/21/1983   Tdap 02/27/2011     The following portions of the patient's history were reviewed and updated as appropriate: allergies, current medications, past medical history, past social history, past surgical history and problem list.  Objective:  There were no vitals filed for this visit.  Physical Exam Vitals and nursing note reviewed.  Constitutional:      Appearance: Normal appearance. She is normal weight.  HENT:     Head: Normocephalic and atraumatic.     Mouth/Throat:     Mouth: Mucous membranes are moist.     Pharynx: Oropharynx is clear. No oropharyngeal exudate or posterior oropharyngeal erythema.  Eyes:     Conjunctiva/sclera: Conjunctivae normal.  Pulmonary:     Effort: Pulmonary effort is normal.  Abdominal:     Palpations: Abdomen is soft. There is no mass.     Tenderness: There is no abdominal tenderness. There is no rebound.     Comments: Soft without masses or tenderness, fair tone  Genitourinary:    General: Normal vulva.     Exam position: Lithotomy position.     Pubic Area: No rash or pubic lice.      Labia:        Right: No rash or lesion.        Left: No rash or lesion.      Vagina: Vaginal discharge (clear leukorrhea, ph<4.5) present. No erythema, bleeding or lesions.  Cervix: Normal.     Uterus: Normal.      Adnexa: Right adnexa normal and left adnexa normal.     Rectum: Normal.     Comments: pH = <4.5 Lymphadenopathy:     Head:     Right side of head: No preauricular or posterior auricular adenopathy.     Left side of head: No preauricular or posterior auricular adenopathy.     Cervical: No cervical adenopathy.     Right cervical: No superficial, deep or posterior cervical adenopathy.    Left cervical: No superficial, deep or posterior cervical adenopathy.     Upper Body:     Right upper body: No supraclavicular, axillary  or epitrochlear adenopathy.     Left upper body: No supraclavicular, axillary or epitrochlear adenopathy.     Lower Body: No right inguinal adenopathy. No left inguinal adenopathy.  Skin:    General: Skin is warm and dry.     Findings: No rash.  Neurological:     Mental Status: She is alert and oriented to person, place, and time.      Assessment and Plan:  Tara Salas is a 41 y.o. female presenting to the [facility] Department for STI screening  1. Screening examination for venereal disease Treat wet mount per standing orders Immunization nurse consult  - WET PREP FOR [place], YEAST, [place] [facility]     Return if symptoms worsen or fail to improve.  No future appointments.  Herbie Saxon, CNM

## 2022-01-04 ENCOUNTER — Ambulatory Visit (LOCAL_COMMUNITY_HEALTH_CENTER): Payer: Medicaid Other

## 2022-01-04 VITALS — BP 164/116 | Ht 64.0 in | Wt 168.0 lb

## 2022-01-04 DIAGNOSIS — Z3009 Encounter for other general counseling and advice on contraception: Secondary | ICD-10-CM

## 2022-01-04 DIAGNOSIS — Z3042 Encounter for surveillance of injectable contraceptive: Secondary | ICD-10-CM | POA: Diagnosis not present

## 2022-01-04 NOTE — Progress Notes (Signed)
11 weeks 6 days post depo.  Initial Bp right arm 164/116.  Recheck left arm 152/102.  Denies headaches, states occasional visual flashing/spots, and chest pain x1 but thinks was indigestion.  Quit smoking 08/21/22.  States seen in ED one year ago and took Bp med x 1 month and no followup since then.  No PCP but has been referred to [facility] but has not provided them with needed paperwork.  Stressed importance of following up elevated Bp; also gave PCP resource list.  Pt states she can check her Bp at home also.  Consulted with Glenna Fellows FNP and ok to continue depo but needs to followup with PCP re elevated BP.    Depo given IM RUOQ; tolerated well.  Next depo due 03/22/22; has appt reminder.    Cherlynn Polo, RN

## 2022-01-06 NOTE — Progress Notes (Signed)
Consulted by RN re: patient situation.  Reviewed RN note and agree that it reflects our discussion and my recommendations. Vincent Streater, FNP  

## 2022-03-22 ENCOUNTER — Ambulatory Visit (LOCAL_COMMUNITY_HEALTH_CENTER): Payer: Medicaid Other

## 2022-03-22 VITALS — BP 173/108 | Ht 64.0 in | Wt 167.5 lb

## 2022-03-22 DIAGNOSIS — Z309 Encounter for contraceptive management, unspecified: Secondary | ICD-10-CM | POA: Diagnosis not present

## 2022-03-22 DIAGNOSIS — Z3009 Encounter for other general counseling and advice on contraception: Secondary | ICD-10-CM | POA: Diagnosis not present

## 2022-03-22 DIAGNOSIS — Z3042 Encounter for surveillance of injectable contraceptive: Secondary | ICD-10-CM | POA: Diagnosis not present

## 2022-03-22 NOTE — Progress Notes (Signed)
11 weeks post depo and denies any complaints.  Bp 173/108.  Bp elevated at last depo appt 01/04/22 and pt was referred to medical provider to followup Bp.  States she still has not followed up.  Denies s/s associated with elevated Bp other than one minute of right upper chest pain one morning while laying in bed.  Not smoking and says she quit July 2023.  Reports her mom has high Bp.   Consulted Ola Spurr CNM re: Bp reading and continuing depo. and she said administer depo today but pt must go to ER or see PCP today.  Instructed pt; pt states she now has [employer].  Stressed again the need to see MD for elevated BP and pt states she does not really want to take medication.  Gave list of medical providers.  Depo given IM LUOQ as ordered by E. Sciora CNM dated 10/13/21; tolerated well.    Next depo due 06/07/22; appt reminder given.  Tonny Branch, RN

## 2022-06-07 ENCOUNTER — Ambulatory Visit (LOCAL_COMMUNITY_HEALTH_CENTER): Payer: Medicaid Other

## 2022-06-07 VITALS — BP 169/109 | Ht 64.0 in | Wt 167.0 lb

## 2022-06-07 DIAGNOSIS — Z3042 Encounter for surveillance of injectable contraceptive: Secondary | ICD-10-CM

## 2022-06-07 DIAGNOSIS — Z3009 Encounter for other general counseling and advice on contraception: Secondary | ICD-10-CM | POA: Diagnosis not present

## 2022-06-07 DIAGNOSIS — Z719 Counseling, unspecified: Secondary | ICD-10-CM

## 2022-06-07 DIAGNOSIS — Z308 Encounter for other contraceptive management: Secondary | ICD-10-CM

## 2022-06-07 DIAGNOSIS — Z23 Encounter for immunization: Secondary | ICD-10-CM

## 2022-06-07 NOTE — Progress Notes (Signed)
Client seen in nurse clinic for Depo and Tdap Vaccine.  Vaccine administered and tolerated well in Right Deltoid.  Copy of [facility] provided, after vaccine care provided.   Davielle Lingelbach Sherrilyn Rist, RN

## 2022-06-07 NOTE — Progress Notes (Signed)
11 weeks 0 days post depo and voice no concerns.   Client with elevated BP, also had elevated BP at last visit.  Consulted with [facility] regarding BP.  Information provided about dangers of elevated BP and need to find PCP.  Client verbalized understanding of importance of finding PCP and getting BP checked.  Counseled to go to Emergency Dept or urgent care for elevated bp with symptoms such as headache, dizziness, blurred vision, or signs of stroke.   Depo give today as ordered by E. Sciora on 10/13/2021 in RUOQ and tolerated well.   Next Depo due 08/23/2022, has reminder.  Mace Weinberg Sherrilyn Rist, RN

## 2022-06-15 IMAGING — CR DG KNEE COMPLETE 4+V*L*
1 series · 4 of 4 positions shown · non-contrast
Comparison: None.

CLINICAL DATA: Fall

EXAM:
LEFT KNEE - COMPLETE 4+ VIEW

[Series 1: dg knee complete 4 views left · 0.14mm/px · 4 of 4 slices shown]
[im 1/4]
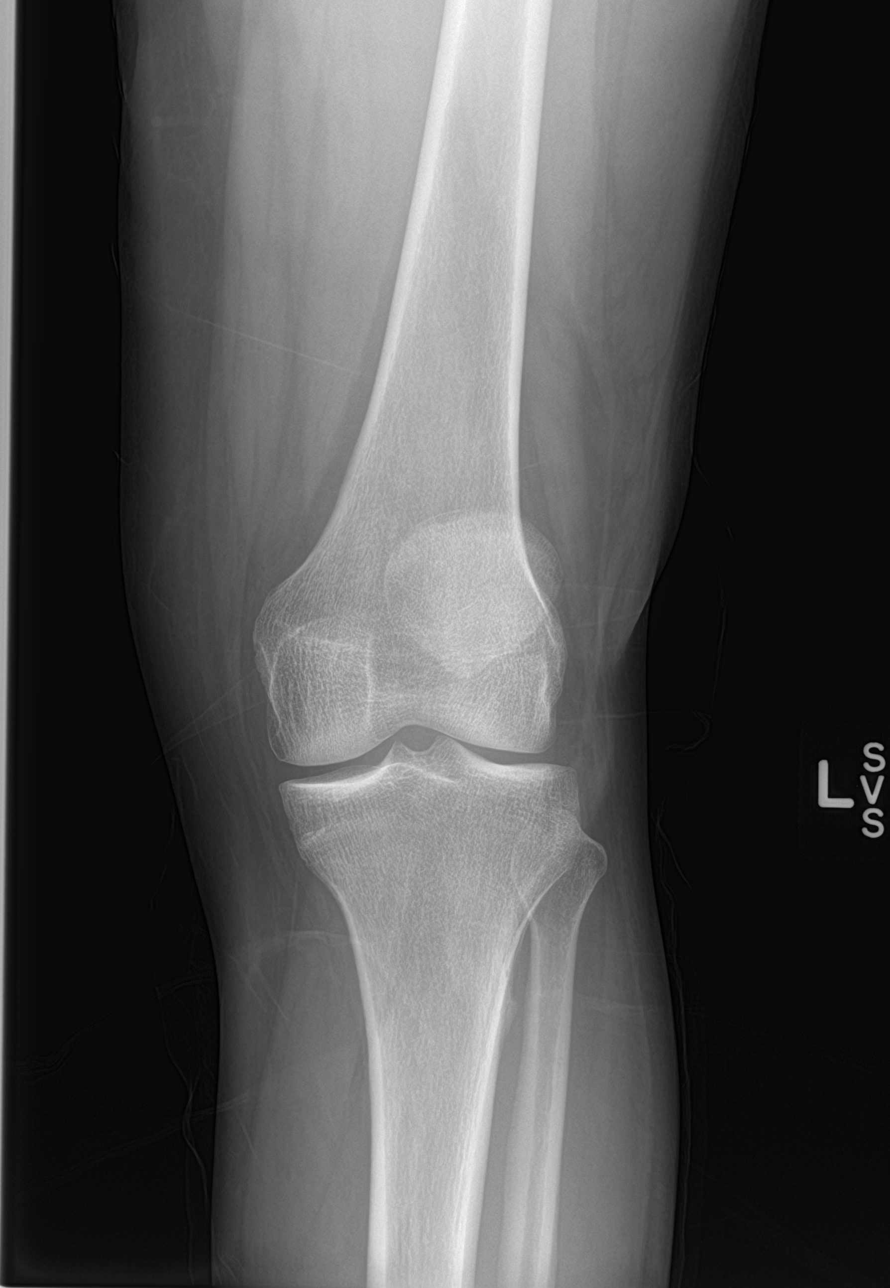
[im 2/4]
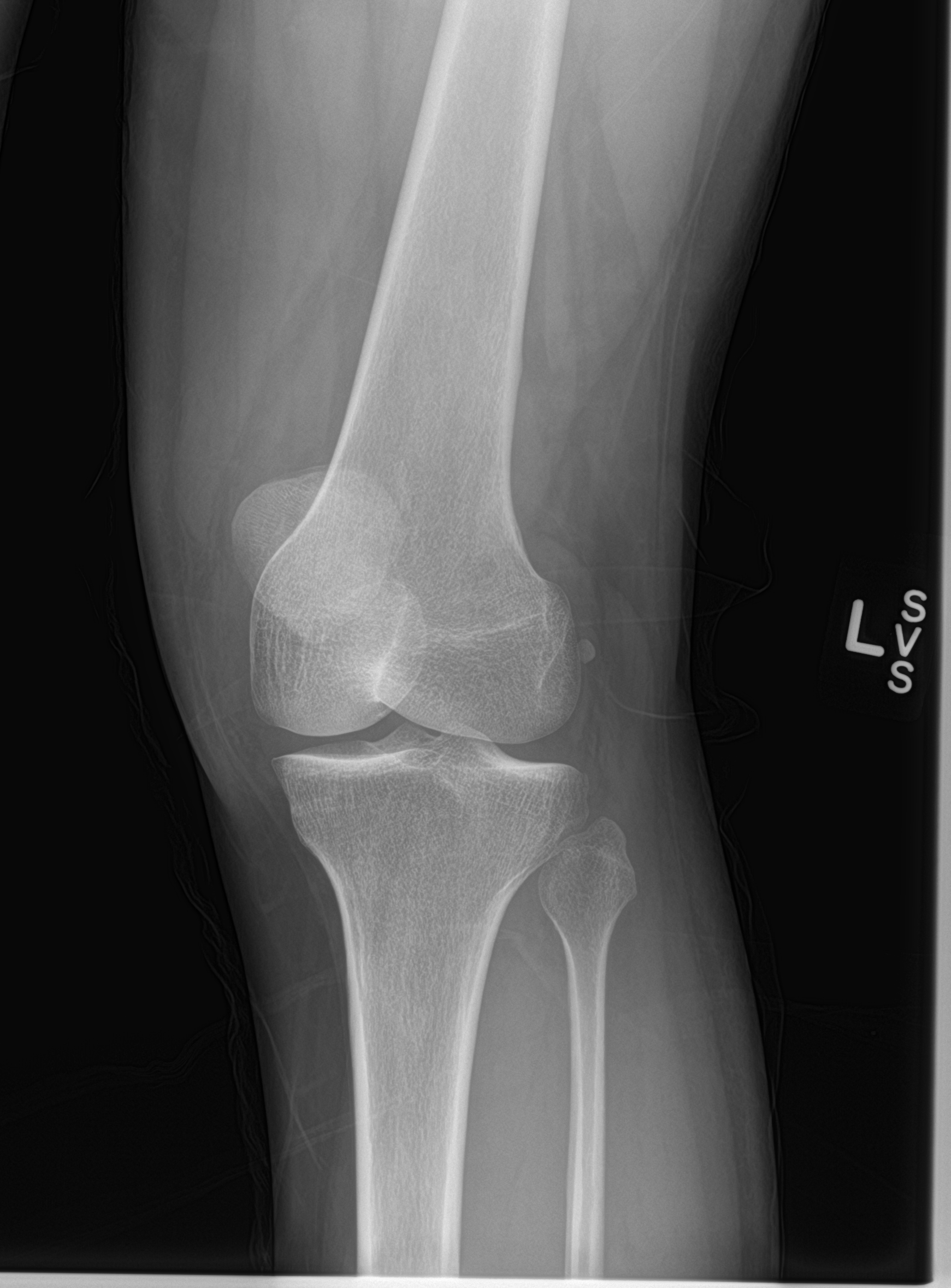
[im 3/4]
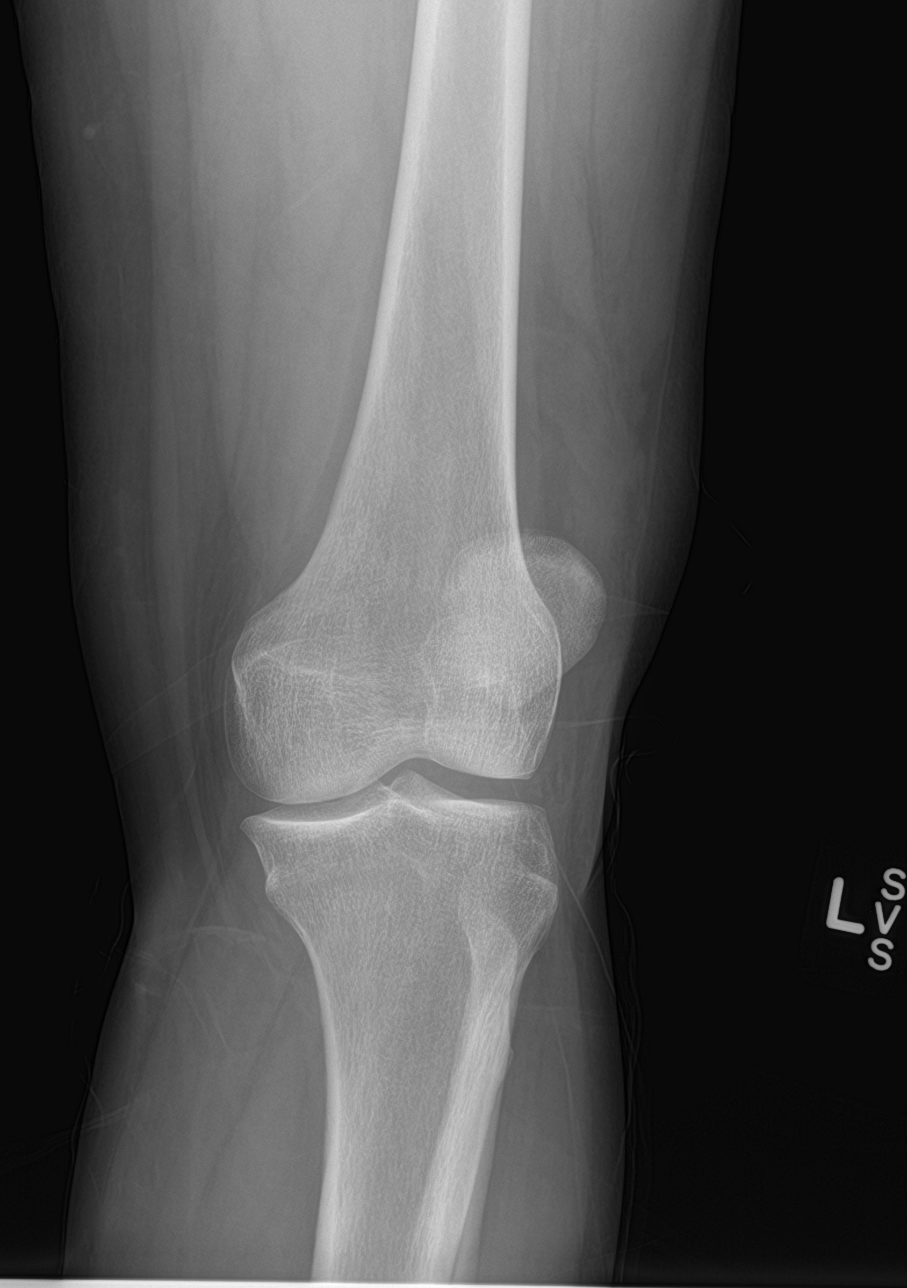
[im 4/4]
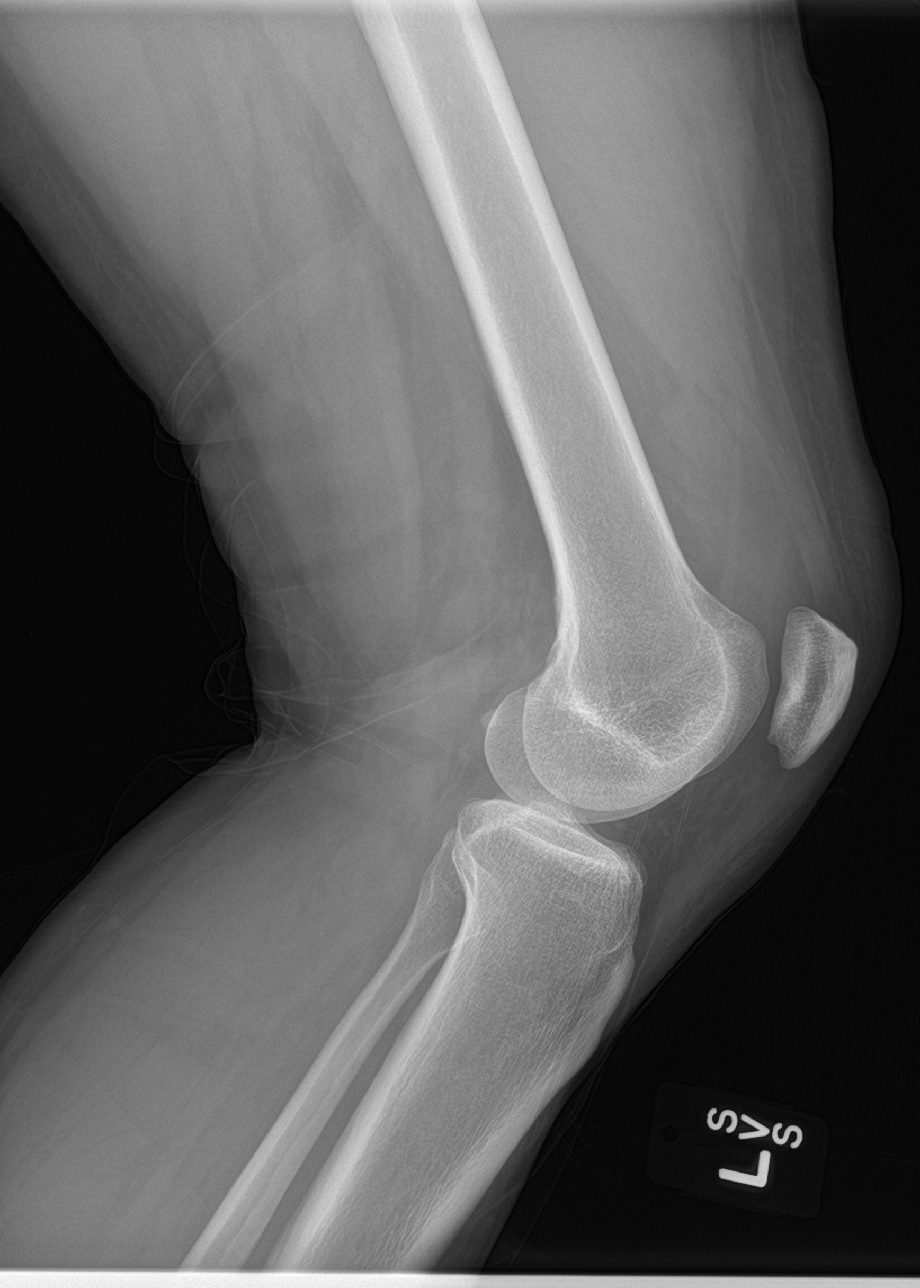

[4 of 4 positions shown; findings below may reference images not displayed]

FINDINGS: No evidence of fracture, dislocation, or joint effusion. No evidence
of arthropathy or other focal bone abnormality. Soft tissues are
unremarkable.
IMPRESSION: Negative.

## 2022-08-25 ENCOUNTER — Ambulatory Visit: Payer: BC Managed Care – PPO

## 2022-08-25 VITALS — BP 160/107 | Ht 64.0 in | Wt 170.5 lb

## 2022-08-25 DIAGNOSIS — Z308 Encounter for other contraceptive management: Secondary | ICD-10-CM

## 2022-08-25 DIAGNOSIS — Z3042 Encounter for surveillance of injectable contraceptive: Secondary | ICD-10-CM

## 2022-08-25 DIAGNOSIS — Z30013 Encounter for initial prescription of injectable contraceptive: Secondary | ICD-10-CM | POA: Diagnosis not present

## 2022-08-25 DIAGNOSIS — Z3009 Encounter for other general counseling and advice on contraception: Secondary | ICD-10-CM

## 2022-08-25 MED ORDER — MEDROXYPROGESTERONE ACETATE 150 MG/ML IM SUSP
150.0000 mg | Freq: Once | INTRAMUSCULAR | Status: AC
  Administered 2022-08-25: 150 mg via INTRAMUSCULAR

## 2022-08-25 NOTE — Progress Notes (Signed)
11 weeks 2 days post depo. BP elevated today 160/107. Has consistently high BP at [facility] visits. No PCP. Has insurance. Local provider resource list given. Discussed with patient, dangers and risks of chronic HBP and need for evaluation and BP management.  Denies symptoms of chest pain, SOB, headache, vision changes today.  Stopped smoking one year ago. Denies caffeine.   Phone consult Dr Ralene Bathe who gives ok for depo today. Advises RN to discuss with patient dangers and risks of HBP and need for management of chronic HBP.   RN carried out provider orders. Pt states understanding of need for HBP evaluatin, but says she does not want to take HBP pills and wants to try alternative methods. RN encouraged her to have evaluation and discuss options with provider.   Depo given today and tolerated well LUOQ. Has PE scheduled 09/14/2022 and next depo due 11/10/2022, has reminders. Jerel Shepherd, RN

## 2022-09-01 NOTE — Progress Notes (Signed)
Attestation of Attending Supervision of clinical support staff: I agree with the care provided to this patient and was available for any consultation.  I have reviewed the RN's note and chart. I was available for consult and to see the patient if needed.   Torunn Chancellor MD MPH Attending Physician Faculty Practice- [facility]  

## 2022-09-12 ENCOUNTER — Ambulatory Visit (LOCAL_COMMUNITY_HEALTH_CENTER): Payer: BC Managed Care – PPO | Admitting: Advanced Practice Midwife

## 2022-09-12 ENCOUNTER — Encounter: Payer: Self-pay | Admitting: Advanced Practice Midwife

## 2022-09-12 VITALS — BP 166/99 | HR 82 | Ht 64.0 in | Wt 165.0 lb

## 2022-09-12 DIAGNOSIS — Z3009 Encounter for other general counseling and advice on contraception: Secondary | ICD-10-CM

## 2022-09-12 DIAGNOSIS — Z308 Encounter for other contraceptive management: Secondary | ICD-10-CM | POA: Diagnosis not present

## 2022-09-12 DIAGNOSIS — I10 Essential (primary) hypertension: Secondary | ICD-10-CM

## 2022-09-12 DIAGNOSIS — F17200 Nicotine dependence, unspecified, uncomplicated: Secondary | ICD-10-CM

## 2022-09-12 LAB — WET PREP FOR TRICH, YEAST, CLUE
Trichomonas Exam: NEGATIVE
Yeast Exam: NEGATIVE

## 2022-09-12 LAB — HEMOGLOBIN, FINGERSTICK: Hemoglobin: 13.1 g/dL (ref 11.1–15.9)

## 2022-09-12 LAB — HM HIV SCREENING LAB: HM HIV Screening: NEGATIVE

## 2022-09-12 MED ORDER — MEDROXYPROGESTERONE ACETATE 150 MG/ML IM SUSP
150.0000 mg | INTRAMUSCULAR | Status: AC
  Administered 2022-11-10 – 2023-04-16 (×3): 150 mg via INTRAMUSCULAR
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
## 2022-09-12 NOTE — Progress Notes (Addendum)
Pt is here for family planning visit.  Family planning packet reviewed and given to pt.  Wet prep results reviewed, no treatment required per provider. Condoms declined. Info to schedule mammogram given.  Gaspar Garbe, RN

## 2022-09-13 LAB — HGB A1C W/O EAG: Hgb A1c MFr Bld: 5.5 % (ref 4.8–5.6)

## 2022-11-09 ENCOUNTER — Encounter: Payer: Self-pay | Admitting: Family Medicine

## 2022-11-09 ENCOUNTER — Ambulatory Visit: Payer: BC Managed Care – PPO | Admitting: Family Medicine

## 2022-11-09 DIAGNOSIS — B9689 Other specified bacterial agents as the cause of diseases classified elsewhere: Secondary | ICD-10-CM

## 2022-11-09 DIAGNOSIS — N760 Acute vaginitis: Secondary | ICD-10-CM

## 2022-11-09 DIAGNOSIS — Z113 Encounter for screening for infections with a predominantly sexual mode of transmission: Secondary | ICD-10-CM

## 2022-11-09 LAB — WET PREP FOR TRICH, YEAST, CLUE
Trichomonas Exam: NEGATIVE
Yeast Exam: NEGATIVE

## 2022-11-09 MED ORDER — METRONIDAZOLE 500 MG PO TABS: 500.0000 mg | ORAL_TABLET | Freq: Two times a day (BID) | ORAL | Status: AC

## 2022-11-09 NOTE — Addendum Note (Signed)
Addended by: Gaspar Garbe on: 11/09/2022 09:02 AM   Modules accepted: Orders

## 2022-11-09 NOTE — Progress Notes (Signed)
Pt is here for STD visit.  Wet prep results reviewed with pt, treatment required per standing order.  Condoms declined.   The patient was dispensed metronidazole today. I provided counseling today regarding the medication. We discussed the medication, the side effects and when to call clinic. Patient given the opportunity to ask questions. Questions answered.  Gaspar Garbe, RN
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
## 2022-11-10 ENCOUNTER — Ambulatory Visit: Payer: BC Managed Care – PPO

## 2022-11-10 VITALS — BP 167/101 | Ht 64.0 in | Wt 168.0 lb

## 2022-11-10 DIAGNOSIS — Z308 Encounter for other contraceptive management: Secondary | ICD-10-CM

## 2022-11-10 DIAGNOSIS — Z3009 Encounter for other general counseling and advice on contraception: Secondary | ICD-10-CM

## 2022-11-10 DIAGNOSIS — Z3042 Encounter for surveillance of injectable contraceptive: Secondary | ICD-10-CM

## 2022-11-10 DIAGNOSIS — Z30013 Encounter for initial prescription of injectable contraceptive: Secondary | ICD-10-CM | POA: Diagnosis not present

## 2022-11-10 NOTE — Progress Notes (Signed)
11 weeks post depo. BP elevated today 167/101. Has consistently high BP at [facility] visits. No PCP. Has insurance. Patient stated she has a list of providers in the area and does not need another one.  Patient stated she had not been able to find a PCP.  Discussed with patient, dangers and risks of chronic HBP and need for evaluation and BP management. Denies symptoms of chest pain, SOB, headache, vision changes today. Patient stated she does not smoke. Denies caffeine. Patient had beet juice this morning.   Consult with E, Sciora, CNM who gives ok for depo today. Patient is to be told to go to the ER today for evaluation of HBP obtained today. Advised to discuss with patient dangers and risks of HBP and need for management of chronic HBP.    Provider orders carried out. Pt states understanding of need to go to ER today for evaluation and for management of chronic HBP.   Depo given today and tolerated well RUOQ. Next depo due 01/26/2023, has reminders.

## 2022-11-10 NOTE — Progress Notes (Signed)
11 weeks post depo. BP elevated today 167/101. Has consistently high BP at [facility] visits. No PCP. Has insurance. Patient stated she has a list of providers in the area and does not need another one.  Patient stated she had not been able to find a PCP.  Discussed with patient, dangers and risks of chronic HBP and need for evaluation and BP management. Denies symptoms of chest pain, SOB, headache, vision changes today. Patient stated she does not smoke. Denies caffeine. Patient had beet juice this morning.   Consult with E, Jeanee Fabre, CNM who gives ok for depo today. Patient is to be told to go to the ER today for evaluation of HBP obtained today. Advised to discuss with patient dangers and risks of HBP and need for management of chronic HBP.    Provider orders carried out. Pt states understanding of need to go to ER today for evaluation and for management of chronic HBP.   Depo given today and tolerated well RUOQ. Next depo due 01/26/2023, has reminders.   Regenia Skeeter, [facility] Consulted on the plan of care for this client.  I agree with the documented note and actions taken to provide care for this client.  Hazle Coca, CNM

## 2023-01-26 ENCOUNTER — Ambulatory Visit: Payer: BC Managed Care – PPO

## 2023-01-26 VITALS — BP 180/114 | Ht 64.0 in | Wt 167.5 lb

## 2023-01-26 DIAGNOSIS — Z3042 Encounter for surveillance of injectable contraceptive: Secondary | ICD-10-CM

## 2023-01-26 DIAGNOSIS — Z30013 Encounter for initial prescription of injectable contraceptive: Secondary | ICD-10-CM

## 2023-01-26 DIAGNOSIS — Z308 Encounter for other contraceptive management: Secondary | ICD-10-CM

## 2023-01-26 DIAGNOSIS — Z3009 Encounter for other general counseling and advice on contraception: Secondary | ICD-10-CM

## 2023-01-26 NOTE — Progress Notes (Signed)
11 Weeks   0 Days since last Depo   BP elevated today at 186/114 and 10 min later 196/118. States she feels anxious today and her BP is always high when she comes here.  Denies vision changes, chest pain,SOB. Denies caffeine today and does not smoke.   Reports her BP at home this am was 159/106. Historically has HBP. Has not established PCP but considering [facility]. States she doesn't want to take meds for HBP.  States she is taking beet root to help with BP  Consult E Sciora, CNM who gives verbal order for depo today. Advises RN to counsel patient on unmanaged HBP and consequences and need for eval and management of HBP.  Advises patient to seek immediate medical attn/ go to ER/urgent care.  RN discussed provider recommendations with patient. States she can go to urgent care.  PCP list offered but patient declined as she has it at home.  List of HBP readings from visit today and last visit 10/2022 written down and given to patient.   Questions answered and reports understanding.   Counseled to adhere to 11 to 13 week intervals between depo injections for optimal benefit.  Depo given today per order by E Sciora, CNM Tolerated well LUOQ.  Next depo due 04/13/2023 has reminder card. Tara Shepherd, RN

## 2023-01-26 NOTE — Progress Notes (Signed)
11 Weeks   0 Days since last Depo   BP elevated today at 186/114 and 10 min later 196/118. States she feels anxious today and her BP is always high when she comes here.  Denies vision changes, chest pain,SOB. Denies caffeine today and does not smoke.   Reports her BP at home this am was 159/106. Historically has HBP. Has not established PCP but considering [facility]. States she doesn't want to take meds for HBP.  States she is taking beet root to help with BP  Consult E Leathia Farnell, CNM who gives verbal order for depo today. Advises RN to counsel patient on unmanaged HBP and consequences and need for eval and management of HBP.  Advises patient to seek immediate medical attn/ go to ER/urgent care.  RN discussed provider recommendations with patient. States she can go to urgent care.  PCP list offered but patient declined as she has it at home.  List of HBP readings from visit today and last visit 10/2022 written down and given to patient.   Questions answered and reports understanding.   Counseled to adhere to 11 to 13 week intervals between depo injections for optimal benefit.  Depo given today per order by E Maniyah Moller, CNM Tolerated well LUOQ.  Next depo due 04/13/2023 has reminder card. Jerel Shepherd, RN Consulted on the plan of care for this client.  I agree with the documented note and actions taken to provide care for this client.  Hazle Coca, CNM

## 2023-04-13 ENCOUNTER — Ambulatory Visit: Payer: BC Managed Care – PPO

## 2023-04-16 ENCOUNTER — Ambulatory Visit (LOCAL_COMMUNITY_HEALTH_CENTER): Payer: Medicaid Other

## 2023-04-16 VITALS — BP 180/117 | Ht 64.0 in | Wt 167.5 lb

## 2023-04-16 DIAGNOSIS — Z308 Encounter for other contraceptive management: Secondary | ICD-10-CM | POA: Diagnosis not present

## 2023-04-16 DIAGNOSIS — Z3042 Encounter for surveillance of injectable contraceptive: Secondary | ICD-10-CM

## 2023-04-16 DIAGNOSIS — Z30013 Encounter for initial prescription of injectable contraceptive: Secondary | ICD-10-CM

## 2023-04-16 DIAGNOSIS — Z3009 Encounter for other general counseling and advice on contraception: Secondary | ICD-10-CM

## 2023-04-16 NOTE — Progress Notes (Signed)
 11 Weeks   3  Days since last Depo   BP elevated today at 190/113 and 5 min later 180/117 (Manually). States she only got 1 to 2 hours of sleep last night and her BP is always high when she comes here.  Denies vision changes, chest pain,SOB. Denies caffeine today and does not smoke.    Historically has HBP. She did go to Urgent Care after 01/26/2023 visit and was prescribed Amlodipine 5 mg. daily.  She has run out of medication and unable to get a refill of the medication due to a billing issue with Urgent Care.  Patient has appointment to see a PCP at [facility] on March 17th.     Consult with Lorrin Mais MD who gives verbal order for depo today. Advises to counsel patient on unmanaged HBP and consequences and need for eval and management of HBP.  Advises patient to seek immediate medical attn/ go to ER/urgent care. Patient to be advised this is the last time for Depo unless patient is seeing a PCP for HBP.  All of provider recommendations were discussed with patient. Patient states she is doing what she can to get an appointment. Patient states she got pregnant twice when she got her Depo shot after 12 weeks.   List of HBP readings from visit today given to patient.    Questions answered and reports understanding.   Counseled to adhere to 11 to 13 week intervals between depo injections for optimal benefit.  Patient understands another Depo will not be provided unless patient is being treated by a PCP for HBP.   Depo given today per order by C. Larita Fife, MD. Tolerated well RUOQ.  Next depo due 07/02/2023 has reminder card. Tara Salas Rph.

## 2023-04-17 NOTE — Progress Notes (Signed)
 Attestation of Attending Supervision of RN:  I was consulted regarding this patient case and agree with the documentation below.   We will give this patient her depo provera injection today, but she must establish with a primary care physician and address her hypertension before we are able to continue administering her depo provera.   Fayette Pho, MD Clinical Services Medical Director [facility] Department 04/17/23  9:23 AM

## 2023-05-07 ENCOUNTER — Other Ambulatory Visit: Payer: Self-pay | Admitting: Family Medicine

## 2023-05-07 DIAGNOSIS — Z1231 Encounter for screening mammogram for malignant neoplasm of breast: Secondary | ICD-10-CM

## 2023-12-06 ENCOUNTER — Ambulatory Visit

## 2023-12-06 VITALS — BP 117/73 | HR 78 | Ht 64.0 in | Wt 161.4 lb

## 2023-12-06 DIAGNOSIS — Z3009 Encounter for other general counseling and advice on contraception: Secondary | ICD-10-CM

## 2023-12-06 DIAGNOSIS — Z30013 Encounter for initial prescription of injectable contraceptive: Secondary | ICD-10-CM | POA: Diagnosis not present

## 2023-12-06 DIAGNOSIS — Z131 Encounter for screening for diabetes mellitus: Secondary | ICD-10-CM

## 2023-12-06 DIAGNOSIS — Z308 Encounter for other contraceptive management: Secondary | ICD-10-CM | POA: Diagnosis not present

## 2023-12-06 DIAGNOSIS — Z62819 Personal history of unspecified abuse in childhood: Secondary | ICD-10-CM | POA: Insufficient documentation

## 2023-12-06 DIAGNOSIS — Z3042 Encounter for surveillance of injectable contraceptive: Secondary | ICD-10-CM

## 2023-12-06 DIAGNOSIS — Z124 Encounter for screening for malignant neoplasm of cervix: Secondary | ICD-10-CM

## 2023-12-06 DIAGNOSIS — Z113 Encounter for screening for infections with a predominantly sexual mode of transmission: Secondary | ICD-10-CM

## 2023-12-06 LAB — WET PREP FOR TRICH, YEAST, CLUE
Clue Cell Exam: NEGATIVE
Trichomonas Exam: NEGATIVE
Yeast Exam: NEGATIVE

## 2023-12-06 LAB — HM HIV SCREENING LAB: HM HIV Screening: NEGATIVE

## 2023-12-06 MED ORDER — MEDROXYPROGESTERONE ACETATE 150 MG/ML IM SUSP
150.0000 mg | INTRAMUSCULAR | Status: AC
  Administered 2023-12-06 – 2024-02-25 (×2): 150 mg via INTRAMUSCULAR

## 2023-12-06 NOTE — Progress Notes (Signed)
 Pt is her for PE/Depo IM Injection. Injection given to pt at the [facility] and pt tolerated well to injection. Reminder card given and condoms declined, Kwadwo Kerney Hopfensperger,RN.
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
## 2023-12-07 ENCOUNTER — Ambulatory Visit: Payer: Self-pay

## 2023-12-07 LAB — HGB A1C W/O EAG: Hgb A1c MFr Bld: 5.4 % (ref 4.8–5.6)

## 2023-12-07 NOTE — Progress Notes (Signed)
 Wet prep reviewed in clinic. HgbA1C normal range.  Tara Salas [facility]

## 2023-12-10 LAB — IGP, APTIMA HPV
HPV Aptima: NEGATIVE
PAP Smear Comment: 0

## 2023-12-10 NOTE — Progress Notes (Signed)
 Pap normal with negative HPV. Pap in 2022 was normal cytology with negative HPV. In 2021, she had a pap result of LSIL with negative HPV.  She can now resume normal screening per current ASCCP guidelines - follow up in 5 years.  Damien Satchel [facility]

## 2024-02-25 ENCOUNTER — Ambulatory Visit (LOCAL_COMMUNITY_HEALTH_CENTER)

## 2024-02-25 VITALS — BP 122/68 | Ht 64.0 in | Wt 167.0 lb

## 2024-02-25 DIAGNOSIS — Z3009 Encounter for other general counseling and advice on contraception: Secondary | ICD-10-CM

## 2024-02-25 DIAGNOSIS — Z3042 Encounter for surveillance of injectable contraceptive: Secondary | ICD-10-CM | POA: Diagnosis not present

## 2024-02-25 NOTE — Progress Notes (Signed)
 11 Weeks   4 Days since last [employer] no concerns today.  Counseled to adhere to 11 to 13 week intervals between depo injections for optimal benefit.  Depo given today per order by FORBES Satchel, [facility]  dated 12/06/2023.  Tolerated well LUOQ. SABRA  Next depo due 05/12/2024 has reminder card. Autrey Human, RN

## 2024-03-06 ENCOUNTER — Ambulatory Visit
Admission: RE | Admit: 2024-03-06 | Discharge: 2024-03-06 | Disposition: A | Discharge status: Home / Self Care | Payer: Self-pay | Source: Ambulatory Visit | Attending: Family Medicine | Admitting: Family Medicine

## 2024-03-06 DIAGNOSIS — Z1231 Encounter for screening mammogram for malignant neoplasm of breast: Secondary | ICD-10-CM | POA: Diagnosis present
